# Patient Record
Sex: Male | Born: 1985 | Race: White | Hispanic: No | Marital: Single | Smoking: Current every day smoker
Health system: Southern US, Community
[De-identification: ages and names within clinical notes are randomized; demographics above are authoritative.]

## PROBLEM LIST (undated history)

## (undated) DIAGNOSIS — N200 Calculus of kidney: Secondary | ICD-10-CM

## (undated) DIAGNOSIS — M1990 Unspecified osteoarthritis, unspecified site: Secondary | ICD-10-CM

## (undated) DIAGNOSIS — F419 Anxiety disorder, unspecified: Secondary | ICD-10-CM

## (undated) DIAGNOSIS — M545 Low back pain, unspecified: Secondary | ICD-10-CM

## (undated) DIAGNOSIS — F6089 Other specific personality disorders: Secondary | ICD-10-CM

## (undated) HISTORY — DX: Unspecified osteoarthritis, unspecified site: M19.90

## (undated) HISTORY — DX: Anxiety disorder, unspecified: F41.9

---
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
## 2004-03-16 ENCOUNTER — Emergency Department
Admission: EM | Admit: 2004-03-16 | Disposition: A | Discharge status: Home / Self Care | Payer: Self-pay | Source: Ambulatory Visit

## 2004-03-27 ENCOUNTER — Emergency Department
Admission: EM | Admit: 2004-03-27 | Disposition: A | Discharge status: Home / Self Care | Payer: Self-pay | Source: Ambulatory Visit

## 2004-03-29 ENCOUNTER — Emergency Department
Admission: EM | Admit: 2004-03-29 | Disposition: A | Discharge status: Home / Self Care | Payer: Self-pay | Source: Ambulatory Visit

## 2004-04-01 ENCOUNTER — Emergency Department
Admission: EM | Admit: 2004-04-01 | Disposition: A | Discharge status: Home / Self Care | Payer: Self-pay | Source: Ambulatory Visit

## 2004-08-09 ENCOUNTER — Emergency Department
Admission: EM | Admit: 2004-08-09 | Disposition: A | Discharge status: Home / Self Care | Payer: Self-pay | Source: Ambulatory Visit

## 2008-04-08 ENCOUNTER — Emergency Department
Admission: EM | Admit: 2008-04-08 | Disposition: A | Discharge status: Home / Self Care | Payer: Self-pay | Source: Ambulatory Visit

## 2009-04-05 ENCOUNTER — Emergency Department
Admission: EM | Admit: 2009-04-05 | Disposition: A | Discharge status: Home / Self Care | Payer: Self-pay | Source: Ambulatory Visit

## 2013-10-16 ENCOUNTER — Emergency Department: Payer: Self-pay

## 2013-10-16 ENCOUNTER — Emergency Department
Admission: EM | Admit: 2013-10-16 | Discharge: 2013-10-16 | Disposition: A | Discharge status: Home / Self Care | Payer: Self-pay | Attending: Emergency Medicine | Admitting: Emergency Medicine

## 2013-10-16 DIAGNOSIS — B349 Viral infection, unspecified: Secondary | ICD-10-CM | POA: Insufficient documentation

## 2013-10-16 DIAGNOSIS — F17200 Nicotine dependence, unspecified, uncomplicated: Secondary | ICD-10-CM | POA: Insufficient documentation

## 2013-10-16 LAB — VH STREP A RAPID TEST: Strep A, Rapid: NEGATIVE

## 2013-10-16 NOTE — Discharge Instructions (Signed)
Tylenol or ibuprofen for discomfort. Fluids. Be rechecked if any concerns.    Viral Syndrome (Adult)  A viral illness may cause a number of symptoms. The symptoms depend on the part of the body that the virus affects. If it settles in the nose, throat, and lungs, it may cause cough, sore throat, congestion, and sometimes headache. If it settles in the stomach and intestinal tract, it may cause vomiting and diarrhea. Sometimes it causes vague symptoms like "aching all over," feeling tired, loss of appetite, or fever.  A viral illness usually lasts1 to 2 weeks, but sometimes it lasts longer. In some cases, a more serious infection can look like a viral syndrome in the first few days of the illness. You may need anotherexam and additional teststo know the difference.Watch for the warning signs listed below.  Home care  Follow these guidelines for taking care of yourself at home:   If symptoms are severe, rest at home for the first 2 to 3 days.   Stay away from cigarette smoke - both your smoke and the smoke from others.   You may useacetaminophen or ibuprofen for fever, muscle aching, and headache, unless another medicine was prescribed for this.If you have chronic liver or kidney disease or ever had a stomach ulcer or GI bleeding, talk with your doctor before using these medicinesNo one who is younger than 50 and ill with a fever should take aspirin. It may cause severe liver damage.   Your appetite may be poor, so a light diet is fine. Avoid dehydration by drinking 8 to 12 8-ounce glasses of fluids each day. This may include water; orange juice; lemonade; apple, grape, and cranberry juice; clear fruit drinks; electrolyte replacement and sports drinks; and decaffeinated teas and coffee. If you have been diagnosed with a kidney disease, ask your doctor how much and what types of fluids you should drink to prevent dehydration. If you have kidney disease, drinking too much fluid can cause it build up in the  your body and be dangerous to your health.   Over-the-counter remedies won't shorten the length of the illness but may be helpful forcough, sore throat; and nasal and sinus congestion. Don't use decongestants if you have high blood pressure.  Follow-up care  Follow up with your health care provider if you do not improve over the next week.  When to seek medical care  Get prompt medical attention if any of these occur:   Cough with lots of colored sputum (mucus) or blood in your sputum   Chest pain, shortness of breath, wheezing, or difficulty breathing   Severe headache; face, neck, or ear pain   Severe, constant pain in the lower right side of your belly (abdominal)   Continued vomiting (can't keep liquids down)   Frequent diarrhea (more than 5 times a day); blood (red or black color) or mucus in diarrhea   Feeling weak, dizzy, or like you are going to faint   Extreme thirst   Fever of 100.4 F (38 C) oral or higher, not better with fever medication   [employer]   [address] [place], [place] [address]. All rights reserved. This information is not intended as a substitute for professional medical care. Always follow your healthcare professional's instructions.

## 2013-10-16 NOTE — ED Provider Notes (Addendum)
Physician/Midlevel provider first contact with patient: 10/16/13 1828         History     Chief Complaint   Patient presents with   . Sore Throat   . Cough   . Headache     HPI  1-2d of nonprod cough; slight sore throat, headache and ant chest pain when coughs. No n/v. Had diarrhea the other day, not now. No stiff neck or fevers or rash.  History reviewed. No pertinent past medical history.    History reviewed. No pertinent past surgical history.    No family history on file.    Social  History   Substance Use Topics   . Smoking status: Current Every Day Smoker -- 0.5 packs/day     Types: Cigarettes   . Smokeless tobacco: Not on file   . Alcohol Use: No       .     No Known Allergies    Current/Home Medications    No medications on file        Review of Systems   Constitutional: Negative for fever.   HENT: Positive for sore throat.    Eyes: Negative.    Respiratory: Positive for cough and shortness of breath.    Cardiovascular: Positive for chest pain.   Gastrointestinal: Positive for diarrhea. Negative for nausea, vomiting and abdominal pain.   Genitourinary: Negative.    Musculoskeletal: Negative.  Negative for arthralgias, myalgias, neck pain and neck stiffness.   Skin: Negative.  Negative for rash.   Neurological: Positive for headaches.   All other systems reviewed and are negative.        Physical Exam    BP: 130/69 mmHg, Heart Rate: 75 , Temp: 98.8 F (37.1 C), Resp Rate: 20 , SpO2: 98 %    Physical Exam   Nursing note and vitals reviewed.  Constitutional: He is oriented to person, place, and time. He appears well-developed and well-nourished. No distress.   HENT:   Head: Normocephalic and atraumatic.   Right Ear: External ear normal.   Left Ear: External ear normal.   Mouth/Throat: Oropharynx is clear and moist. No oropharyngeal exudate.   Eyes: EOM are normal. Pupils are equal, round, and reactive to light. Right eye exhibits no discharge. Left eye exhibits discharge.   Neck: Normal range of motion. Neck  supple.   Cardiovascular: Normal rate and regular rhythm.    Pulmonary/Chest: Effort normal and breath sounds normal. He has no wheezes. He has no rales. He exhibits tenderness (tender to palp of ant sternal-costal border 'that's the pain').   Abdominal: Soft. Bowel sounds are normal. There is no tenderness.   Musculoskeletal: Normal range of motion.   Neurological: He is alert and oriented to person, place, and time. No cranial nerve deficit.   Skin: Skin is warm and dry.       MDM and ED Course     ED Medication Orders     None           MDM      Procedures    Clinical Impression & Disposition     Clinical Impression  Final diagnoses:   Viral illness        ED Disposition     Discharge Meiko Stranahan discharge to home/self care.    Condition at disposition: Stable             New Prescriptions    No medications on file  Johney Maine, MD  10/16/13 Lynelle Smoke    Johney Maine, MD  10/16/13 [phone]

## 2013-10-16 NOTE — ED Notes (Addendum)
Patient complained of st, headache, cough for 1 day.  Diarrhea.

## 2013-10-16 NOTE — ED Notes (Signed)
Discharge instructions given to patient  Released to home

## 2014-12-08 ENCOUNTER — Emergency Department: Payer: Self-pay

## 2014-12-08 ENCOUNTER — Emergency Department
Admission: EM | Admit: 2014-12-08 | Discharge: 2014-12-08 | Disposition: A | Discharge status: Home / Self Care | Payer: Self-pay | Attending: Emergency Medicine | Admitting: Emergency Medicine

## 2014-12-08 DIAGNOSIS — B9689 Other specified bacterial agents as the cause of diseases classified elsewhere: Secondary | ICD-10-CM | POA: Insufficient documentation

## 2014-12-08 DIAGNOSIS — J028 Acute pharyngitis due to other specified organisms: Secondary | ICD-10-CM | POA: Insufficient documentation

## 2014-12-08 MED ORDER — AMOXICILLIN 500 MG PO CAPS
500.0000 mg | ORAL_CAPSULE | Freq: Once | ORAL | Status: DC
Start: 2014-12-08 — End: 2014-12-08
  Administered 2014-12-08: 500 mg via ORAL

## 2014-12-08 MED ORDER — AMOXICILLIN 500 MG PO CAPS
500.0000 mg | ORAL_CAPSULE | Freq: Three times a day (TID) | ORAL | Status: DC
Start: 2014-12-08 — End: 2014-12-15

## 2014-12-08 MED ORDER — AMOXICILLIN 500 MG PO CAPS
ORAL_CAPSULE | Freq: Once | ORAL | Status: DC
Start: 2014-12-08 — End: 2014-12-09

## 2014-12-08 NOTE — ED Notes (Signed)
Pt presents ambulating with steady gait from lobby with complaint of body aches for about two days.  Alert/oriented, no obvious distress, febrile, does not know if he has had a fever at home because he does not have a thermometer.  Pt states he took two Tylenol about an hour ago.

## 2014-12-08 NOTE — Discharge Instructions (Signed)
When You Have a Sore Throat  A sore throat can be painful. There are many reasons why you may have a sore throat. Your healthcare provider will work with you to find the cause of your sore throat. He or she will also find the best treatment for you.  What Causes a Sore Throat?  Sore throats can be caused or worsened by:   Cold or flu viruses   Bacteria   Irritants such as tobacco smoke   Acid reflux  A Healthy Throat  The tonsils are on the sides of the throat near the base of the tongue. They collect viruses and bacteria and help fight infection. The throat (pharynx) is the passage for air. Mucus from the nasal cavity also moves down the passage.  An Inflamed Throat  The tonsils and pharynx can become inflamed due to a cold or flu virus. Postnasal drip (excess mucus draining from the nasal cavity) can irritate the throat. It can also make the throat or tonsils more likely to be infected by bacteria. Severe, untreated tonsillitis in children or adults can cause a pocket of pus (abscess) to form near the tonsil.  Your Evaluation  A medical evaluation can help find the cause of your sore throat. It can also help your healthcare providerchoose the best treatment for you. The evaluation may include a health history, physical exam, and diagnostic tests.  Health History  Your healthcare provider may ask you the following:   How long has the sore throat lasted and how have you been treating it?   Do you have any other symptoms, such as body aches, fever, or cough?   Does your sore throat recur? If so, how often? How many days of school or work have you missed because of a sore throat?   Do you have trouble eating or swallowing?   Have you been told that you snore or have other sleep problems?   Do you have bad breath?   Do you cough up bad-tasting mucus?  Physical Exam  During the exam, your healthcare provider checks your ears, nose, and throat for problems. He or she also checks for swelling in the neck,  and may listen to your chest.  Possible Tests  Other tests your healthcare provider may perform include:   A throat swab to check for bacteria such asstreptococcus (the bacteria that causes strep throat)   A blood test to check for mononucleosis (a viral infection)   A chest x-ray to rule out pneumonia, especially if you have a cough  Treating a Sore Throat  Treatment depends on many factors. What is the likely cause? Is the problem recent? Does it keep coming back? In many cases, the best thing to do is to treat the symptoms, rest, and let the problem heal itself. Antibiotics may help clear up some infections. For cases of severe or recurring tonsillitis, the tonsils may need to be removed.     Are Antibiotics Needed?  If your sore throat is due to a bacterial infection, antibiotics may speed healing and prevent complications. But most sore throats are caused by cold or flu viruses. And antibiotics don't treat viral illness. In fact, using antibiotics when they're not needed may produce bacteria that are harder to kill. Your healthcare provider will prescribe antibiotics only if he or she thinks they are likely to help.  If Antibiotics Are Prescribed  Take the medication exactly as directed. Be sure to finish your prescription even if you're feeling better.   And be sure to ask your healthcare provider or pharmacist what side effects are common and what to do about them.  Is Surgery Needed?  In some cases, tonsils need to be removed. This is often done as outpatient (same-day) surgery. Your healthcare provider may advise removing the tonsils in cases of:   Several severe bouts of tonsillitis in a year. "Severe" episodes include those that lead to missed days of school or work, or that need to be treated with antibiotics.   Tonsillitis that causes breathing problems during sleep.   Tonsillitis caused by food particles collecting in pouches in the tonsils (cryptic tonsillitis).     2000-2015 [employer] [address], [place] [address]. All rights reserved. This information is not intended as a substitute for professional medical care. Always follow your healthcare professional's instructions.

## 2014-12-08 NOTE — ED Provider Notes (Signed)
Physician/Midlevel provider first contact with patient: 12/08/14 2242         History     Chief Complaint   Patient presents with   . Generalized Body Aches     HPI Comments: Pt with c/o body aches.  Pt with two days of diffuse moderate body aches esp in chest and back.  No aggravate or alleviate factors. Pt also with sore throat and nasal congestion.  has fever in the ED.  Pt has a dry cough.  Also with nausea but no emesis.    The history is provided by the patient.            History reviewed. No pertinent past medical history.    History reviewed. No pertinent past surgical history.    History reviewed. No pertinent family history.    Social  History   Substance Use Topics   . Smoking status: Current Every Day Smoker -- 0.50 packs/day     Types: Cigarettes   . Smokeless tobacco: Not on file   . Alcohol Use: No       .     No Known Allergies    Home Medications     No Medications           Review of Systems   Constitutional: Positive for fever. Negative for activity change and appetite change.   HENT: Positive for congestion and sore throat. Negative for ear pain and rhinorrhea.    Respiratory: Positive for cough. Negative for shortness of breath.    Cardiovascular: Negative for chest pain.   Gastrointestinal: Negative for nausea, vomiting, abdominal pain and diarrhea.   Musculoskeletal: Positive for myalgias.   Skin: Negative for rash and wound.   Neurological: Negative for headaches.       Physical Exam    BP: 136/72 mmHg, Heart Rate: 92, Temp: (!) 101.1 F (38.4 C), Resp Rate: 18, SpO2: 100 %, Weight: 68.04 kg    Physical Exam   Constitutional: He appears well-developed and well-nourished. He appears distressed.   HENT:   Head: Normocephalic and atraumatic.   Right Ear: External ear normal.   Left Ear: External ear normal.   Nose: Nose normal.   Mouth/Throat: Oropharyngeal exudate (injected) present.   Eyes: Conjunctivae and EOM are normal. Pupils are equal, round, and reactive to light. Right eye exhibits  no discharge. Left eye exhibits no discharge. No scleral icterus.   Neck: Normal range of motion.   Cardiovascular: Normal rate, regular rhythm and normal heart sounds.  Exam reveals no gallop and no friction rub.    No murmur heard.  Pulmonary/Chest: Effort normal and breath sounds normal. No respiratory distress. He has no wheezes. He has no rales.   Lymphadenopathy:     He has no cervical adenopathy.   Skin: Skin is warm and dry. No rash noted. He is not diaphoretic. No erythema.   Psychiatric: He has a normal mood and affect. His behavior is normal.   Nursing note and vitals reviewed.        MDM and ED Course     ED Medication Orders     Start Ordered     Status Ordering Provider    12/08/14 2248 12/08/14 2247  amoxicillin (AMOXIL) 500 mg (take home medication)   Once     Route: Oral  Ordered Dose: 500 mg     Dorna Bloom, Annasofia Pohl W              MDM  Procedures    Clinical Impression & Disposition     Clinical Impression  Final diagnoses:   Bacterial pharyngitis        ED Disposition     Discharge Broden Holt discharge to home/self care.    Condition at disposition: Stable             New Prescriptions    AMOXICILLIN (AMOXIL) 500 MG CAPSULE    Take 1 capsule (500 mg total) by mouth 3 (three) times daily.                   Chucky May, MD  12/08/14 2251

## 2014-12-15 ENCOUNTER — Emergency Department: Payer: Self-pay

## 2014-12-15 ENCOUNTER — Emergency Department
Admission: EM | Admit: 2014-12-15 | Discharge: 2014-12-15 | Disposition: A | Discharge status: Home / Self Care | Payer: Self-pay | Attending: Emergency Medicine | Admitting: Emergency Medicine

## 2014-12-15 DIAGNOSIS — J029 Acute pharyngitis, unspecified: Secondary | ICD-10-CM | POA: Insufficient documentation

## 2014-12-15 DIAGNOSIS — G43009 Migraine without aura, not intractable, without status migrainosus: Secondary | ICD-10-CM | POA: Insufficient documentation

## 2014-12-15 LAB — CBC AND DIFFERENTIAL
Basophils %: 1.3 % (ref 0.0–3.0)
Basophils Absolute: 0.2 10*3/uL (ref 0.0–0.3)
Eosinophils %: 1.8 % (ref 0.0–7.0)
Eosinophils Absolute: 0.3 10*3/uL (ref 0.0–0.8)
Hematocrit: 40.2 % (ref 39.0–52.5)
Hemoglobin: 15.1 gm/dL (ref 13.0–17.5)
Lymphocytes Absolute: 4.2 10*3/uL (ref 0.6–5.1)
Lymphocytes: 30.8 % (ref 15.0–46.0)
MCH: 33 pg (ref 28–35)
MCHC: 38 gm/dL — ABNORMAL HIGH (ref 31–36)
MCV: 88 fL (ref 80–100)
MPV: 6 fL (ref 6.0–10.0)
Monocytes Absolute: 1.3 10*3/uL (ref 0.1–1.7)
Monocytes: 9.4 % (ref 3.0–15.0)
Neutrophils %: 56.7 % (ref 42.0–78.0)
Neutrophils Absolute: 7.8 10*3/uL (ref 1.7–8.6)
PLT CT: 340 10*3/uL (ref 130–440)
RBC: 4.56 10*6/uL (ref 4.00–5.70)
RDW: 10.9 % (ref 10.5–14.5)
WBC: 13.7 10*3/uL — ABNORMAL HIGH (ref 4.00–11.00)

## 2014-12-15 LAB — COMPREHENSIVE METABOLIC PANEL
ALT: 20 U/L (ref 0–55)
AST (SGOT): 18 U/L (ref 10–42)
Albumin/Globulin Ratio: 1.64 Ratio — ABNORMAL HIGH (ref 0.70–1.50)
Albumin: 4.5 gm/dL (ref 3.5–5.0)
Alkaline Phosphatase: 63 U/L (ref 40–145)
Anion Gap: 17.6 mMol/L (ref 7.0–18.0)
BUN / Creatinine Ratio: 15.5 Ratio (ref 10.0–30.0)
BUN: 13 mg/dL (ref 7–22)
Bilirubin, Total: 2.2 mg/dL — ABNORMAL HIGH (ref 0.1–1.2)
CO2: 21 mMol/L (ref 20.0–30.0)
Calcium: 9.9 mg/dL (ref 8.5–10.5)
Chloride: 105 mMol/L (ref 98–110)
Creatinine: 0.84 mg/dL (ref 0.80–1.30)
EGFR: >60 mL/min/{1.73_m2}
Globulin: 2.7 gm/dL (ref 2.0–4.0)
Glucose: 93 mg/dL (ref 70–99)
Osmolality Calc: 278 mOsm/kg (ref 275–300)
Potassium: 4 mMol/L (ref 3.5–5.3)
Protein, Total: 7.2 gm/dL (ref 6.0–8.3)
Sodium: 140 mMol/L (ref 136–147)

## 2014-12-15 LAB — BILIRUBIN, DIRECT: Bilirubin Direct: 0.7 mg/dL — ABNORMAL HIGH (ref 0.0–0.3)

## 2014-12-15 LAB — VH STREP A RAPID TEST: Strep A, Rapid: NEGATIVE

## 2014-12-15 MED ORDER — SODIUM CHLORIDE 0.9 % IV BOLUS
1000.0000 mL | Freq: Once | INTRAVENOUS | Status: AC
Start: 2014-12-15 — End: 2014-12-15
  Administered 2014-12-15: 1000 mL via INTRAVENOUS

## 2014-12-15 MED ORDER — ONDANSETRON HCL 4 MG/2ML IJ SOLN
INTRAMUSCULAR | Status: AC
Start: 2014-12-15 — End: ?
  Filled 2014-12-15: qty 2

## 2014-12-15 MED ORDER — LEVOFLOXACIN 750 MG PO TABS
750.0000 mg | ORAL_TABLET | Freq: Every day | ORAL | Status: AC
Start: 2014-12-15 — End: 2014-12-20

## 2014-12-15 MED ORDER — KETOROLAC TROMETHAMINE 30 MG/ML IJ SOLN
INTRAMUSCULAR | Status: AC
Start: 2014-12-15 — End: ?
  Filled 2014-12-15: qty 1

## 2014-12-15 MED ORDER — KETOROLAC TROMETHAMINE 30 MG/ML IJ SOLN
30.0000 mg | Freq: Once | INTRAMUSCULAR | Status: AC
Start: 2014-12-15 — End: 2014-12-15
  Administered 2014-12-15: 30 mg via INTRAVENOUS

## 2014-12-15 MED ORDER — ACETAMINOPHEN-CODEINE 120-12 MG/5ML PO SOLN
5.0000 mL | Freq: Three times a day (TID) | ORAL | Status: DC | PRN
Start: 2014-12-15 — End: 2015-03-20

## 2014-12-15 MED ORDER — ONDANSETRON HCL 4 MG/2ML IJ SOLN
4.0000 mg | Freq: Once | INTRAMUSCULAR | Status: AC
Start: 2014-12-15 — End: 2014-12-15
  Administered 2014-12-15: 4 mg via INTRAVENOUS

## 2014-12-15 NOTE — Discharge Instructions (Signed)
Migraine Headache  Migraine headaches are caused by changes in blood flow to the brain. This causes throbbing or constant pain on one or both sides of your head. The pain may last from a few hours to several days. You usually will have nausea, vomiting, sensitivity to light and sound, and blurred vision. A migraine may be triggered by emotional stressor depression, or by hormone changes during the menstrual cycle. Other triggers include birth control pills, overuse of migraine medicines, alcohol or caffeine, foods with tyramine, eye strain, weather changes, missed meals,or too little or too much sleep.  Home care  Follow these tips when taking care of yourself at home:   Don't drive yourself home if you were given pain medicine for your headache. Instead, have someone else drive you home. Try to sleep when you get home. You should feel much better when you wake up.   Cold can help ease migraine symptoms. Put an ice pack on your forehead or at the base of your skull. Put heat on the back of your neck to help ease any neck spasm.   Drink only clear liquids or eat a light diet until your symptoms get better. This will help you avoid nausea and vomiting.  How to prevent migraines  Pay attention to what seems to trigger your headache. Try to avoid the triggers when you can. If you have frequent headaches, consider keeping a headache diary. In it, write down what you were doing, feeling, or eating in the hours before each headache. Show this to your health care provider to help find the cause of your headaches.  If stress seems to be a trigger for your headaches, figure out what is causing stress in your life. Learn new ways to handle your stress. Ideas include regular exercise, biofeedback, self-hypnosis, and meditation. Talk with your health care provider to find out more information about managing stress. Many books and digital media are also available on this subject.  Tyramine is a substance found in many  foods. It can trigger a migraine in some people. These foods contain tyramine:   Chocolate   Yogurt   All cheeses but cottage cheese and cream cheese   Smoked or pickled fish and meat, including herring, caviar, bologna, pepperoni, and salami   Liver   Avocados   Bananas   Figs   Raisins   Red wine  Try staying away from these foods for 1 to 2 months to see if you have fewer headaches.  How to treat future headaches   Take time out at the first sign of a headache, if possible. Find a quiet, dark, comfortable place to sit or lie down. Let yourself relax or sleep.   Put an ice pack on your forehead or on the area of greatest pain. A heating pad and massage may help if you are having a muscle spasm and tightness in your neck.   If you have been prescribed a medicine to stop a migraine headache, use this at the first warning sign of the headache for best results. First signs may be an aura or pain.   If you need to take medicine often for your migraine, talk with your health care provider about other ways to prevent your headaches.  Follow-up care  Follow up with your health care provider if your headache doesn't get better within the next 24 hours. Talk with your provider if you have frequent headaches. He or she can figure out a treatment plan. Ask if   you can have medicine to take at home the next time you get a bad headache. This may keep you from having to visit the emergency department in the future. You may need to see a headache specialist (neurologist) if you continue to have headaches.  When to seek medical advice  Call your health care provider right awayif any of these occur:   Your head pain gets worse, or doesn't get better within 24 hours   You can't keep liquids down (repeated vomiting)   Pain in your sinuses, ears, or throat   Fever of 100.4 F (38 C) or higher, or as directed by your health care provider   Stiff neck   Extreme drowsiness, confusion, or fainting   Dizziness, or  dizziness with spinning sensation (vertigo)   Weakness in an arm or leg, or on one side of your face   Difficulty talking or seeing     2000-2015 [employer] [address] [place], [place] [address]. All rights reserved. This information is not intended as a substitute for professional medical care. Always follow your healthcare professional's instructions.          Preventing Migraine Headaches: Triggers  The first step in preventing migraines is to learn what triggers them. You may then be able to control your triggers to avoid or reduce the severity of your migraines.     Know Your Triggers  Be aware that you may have more than1 trigger, and that some triggers may work together. Common migraine triggers include:   Food and nutrition. Skipping meals or not drinking enough water can trigger headaches. So can certain foods, such as caffeine, monosodium glutamate (MSG),aged cheese, or sausage.   Alcohol. Red wine and other alcoholic beverages are common migraine triggers.   Chemicals. Scents, cleaning products, gasoline, glue, perfume, and paint can be triggers. So can tobacco smoke, including secondhand smoke.   Emotions. Stress can trigger headaches or make them worse once they begin.   Sleep disruption. Staying up late, sleeping late, and traveling across time zones can disrupt your sleep cycle, triggering headaches.   Hormones. Many women notice that migraines tend to occur at a certain point in their menstrual cycle. Birth control pills or hormone replacement therapy may also trigger migraines.   Environment and weather. Air travel, changes in altitude, air pressure changes, hot sun, or bright or flashing lights can be triggers.    Control Your Triggers  These are some of the things you can do to try to control triggers:   Avoid triggers if you can. For example, stay clear of alcohol and foods that trigger your headaches. Use unscented household products. Keep regular sleep habits.  Manage stress to help control emotional triggers.   Change your behavior at times when triggers can't be avoided. For example, make sure to get enough rest and drink plenty of water while you're traveling. Make sure to carry a hat, sunglasses, and your medications. Be alert for migraine symptoms so you can treat a migraine early if it happens.   [address] [employer] [address], [place], [place] [address]. All rights reserved. This information is not intended as a substitute for professional medical care. Always follow your healthcare professional's instructions.    When You Have a Sore Throat  A sore throat can be painful. There are many reasons why you may have a sore throat. Your healthcare provider will work with you to find the cause of your sore throat.  He or she will also find the best treatment for you.  What Causes a Sore Throat?  Sore throats can be caused or worsened by:   Cold or flu viruses   Bacteria   Irritants such as tobacco smoke   Acid reflux  A Healthy Throat  The tonsils are on the sides of the throat near the base of the tongue. They collect viruses and bacteria and help fight infection. The throat (pharynx) is the passage for air. Mucus from the nasal cavity also moves down the passage.  An Inflamed Throat  The tonsils and pharynx can become inflamed due to a cold or flu virus. Postnasal drip (excess mucus draining from the nasal cavity) can irritate the throat. It can also make the throat or tonsils more likely to be infected by bacteria. Severe, untreated tonsillitis in children or adults can cause a pocket of pus (abscess) to form near the tonsil.  Your Evaluation  A medical evaluation can help find the cause of your sore throat. It can also help your healthcare providerchoose the best treatment for you. The evaluation may include a health history, physical exam, and diagnostic tests.  Health History  Your healthcare provider may ask you the following:   How long  has the sore throat lasted and how have you been treating it?   Do you have any other symptoms, such as body aches, fever, or cough?   Does your sore throat recur? If so, how often? How many days of school or work have you missed because of a sore throat?   Do you have trouble eating or swallowing?   Have you been told that you snore or have other sleep problems?   Do you have bad breath?   Do you cough up bad-tasting mucus?  Physical Exam  During the exam, your healthcare provider checks your ears, nose, and throat for problems. He or she also checks for swelling in the neck, and may listen to your chest.  Possible Tests  Other tests your healthcare provider may perform include:   A throat swab to check for bacteria such asstreptococcus (the bacteria that causes strep throat)   A blood test to check for mononucleosis (a viral infection)   A chest x-ray to rule out pneumonia, especially if you have a cough  Treating a Sore Throat  Treatment depends on many factors. What is the likely cause? Is the problem recent? Does it keep coming back? In many cases, the best thing to do is to treat the symptoms, rest, and let the problem heal itself. Antibiotics may help clear up some infections. For cases of severe or recurring tonsillitis, the tonsils may need to be removed.     Are Antibiotics Needed?  If your sore throat is due to a bacterial infection, antibiotics may speed healing and prevent complications. But most sore throats are caused by cold or flu viruses. And antibiotics don't treat viral illness. In fact, using antibiotics when they're not needed may produce bacteria that are harder to kill. Your healthcare provider will prescribe antibiotics only if he or she thinks they are likely to help.  If Antibiotics Are Prescribed  Take the medication exactly as directed. Be sure to finish your prescription even if you're feeling better. And be sure to ask your healthcare provider or pharmacist what side  effects are common and what to do about them.  Is Surgery Needed?  In some cases, tonsils need to be removed. This is often done  as outpatient (same-day) surgery. Your healthcare provider may advise removing the tonsils in cases of:   Several severe bouts of tonsillitis in a year. "Severe" episodes include those that lead to missed days of school or work, or that need to be treated with antibiotics.   Tonsillitis that causes breathing problems during sleep.   Tonsillitis caused by food particles collecting in pouches in the tonsils (cryptic tonsillitis).     [address] [employer] [address], [place], [place] [address]. All rights reserved. This information is not intended as a substitute for professional medical care. Always follow your healthcare professional's instructions.

## 2014-12-15 NOTE — ED Provider Notes (Signed)
HPI/ROS provided by:   [x]  Patient   []  Family   []  EMS   []  Friend   []  Caregiver   []  POA     Chief Complaint   Patient presents with   . Sore Throat       HPI:     29 y.o. male presents with repeat visit for worsening sore throat with L sided HA; got Amoxicillin 01-May 16 and reports he is down to a few pills.      Denies any CHI or trauma; no n/v/d    Per pt, he did have some mid-back pain when he first came in, but it is better now    REVIEW OF SYSTEMS:  CONSTITUTIONAL: + subjective fevers, - chills, - sweating   EYES: - problems with vision; + photophobia   ENT: - sore throat  RESP: - sob, - cough   CV: + chest pain   GI: - abdominal pain, - nausea/vomiting, - diarrhea, - blood in stools  NEURO: + headache, - focal weakness  GU: - dysuria, - hematuria  MS: - leg/calf pain, - leg swelling  SKIN: - rash    PSYCH: - anxiety, - depression  [x]  Except as marked above as positive, the remainder of the systems above were reviewed and found negative.    PAST MEDICAL/SURGICAL HISTORY:  History reviewed. No pertinent past medical history.  History reviewed. No pertinent past surgical history.  Additional Past Medical/Surgical History:     SOCIAL AND FAMILY HISTORY:  History   Substance Use Topics   . Smoking status: Current Every Day Smoker -- 0.50 packs/day     Types: Cigarettes   . Smokeless tobacco: Not on file   . Alcohol Use: No     History reviewed. No pertinent family history.   Additional Social and Family History:    Additional SH: [x]  No additional social history, I reviewed SH above     []  No EtOH/Tobacco/Drugs     []  Tobacco    []  Alcohol:      []  Narcotic abuse    []  Marijuana     []  IVDU:    Other:    Additional FH: [x]  Not asked/Non-contributory, I reviewed FH above    []  Negative    []  Diabetes    []  Cancer      []  CAD    []  HTN    []  Sudden Death    Other:    No Known Allergies  Additional Allergies:    Prior to Admission medications    Medication Sig Start Date End Date Taking? Authorizing Provider    amoxicillin (AMOXIL) 500 MG capsule Take 1 capsule (500 mg total) by mouth 3 (three) times daily. 12/08/14 12/18/14  Chucky May, MD       PHYSICAL EXAM:   BP 121/76 mmHg  Pulse 82  Temp(Src) 98.2 F (36.8 C)  Resp 16  Ht 1.778 m  Wt 65.772 kg  BMI 20.81 kg/m2  SpO2 97%  [x]  Nursing note reviewed [x]  Vitals reviewed     Gen: AAOx4; NAD  HEENT: PERRLA, EOMi; moist MM  CVS: RRR, nl s1/s2; no r/m/g  Lungs: CTAB; no wheezes, crackles, rhonchi  Abd: soft, NT/ND; NABS; no CVATB  Neuro: CN 2-12 grossly intact; no focal deficits  Psych: appropiate affect, normal thought content and processes  Skin: warm, dry, no rashes noted  Extremities: MWAE; neurovascular intact on exam; GCS 15    LABS/TESTS:  Labs Reviewed   CBC AND  DIFFERENTIAL - Abnormal; Notable for the following:     WBC 13.7 (*)     MCHC 38 (*)     All other components within normal limits   COMPREHENSIVE METABOLIC PANEL - Abnormal; Notable for the following:     Bilirubin, Total 2.2 (*)     Albumin/Globulin Ratio 1.64 (*)     All other components within normal limits   BILIRUBIN, DIRECT - Abnormal; Notable for the following:     Bilirubin, Direct 0.7 (*)     All other components within normal limits   VH STREP A RAPID TEST   VH CULTURE, THROAT       RADIOLOGY (viewed & interpreted by me--followed by final report):  Xr Chest Ap Portable    12/15/2014   Normal Chest.  ReadingStation:[id number]      ASSESSMENT:   28 y.o. male with 1) throat pain, 2) L sided migraine HA      PLAN:  1) With still elevated WBC despite Amoxil for 7 days, but no fevers/chills  -? Related to stress (very uncomfortable on arrival) and/or smoking  -Meds and Rx as below  -See ENT at [facility]    2) Improved with meds and IVF  -Meds and Rx as below        Medications   sodium chloride 0.9 % bolus 1,000 mL (0 mLs Intravenous Stopped 12/15/14 0435)   ketorolac (TORADOL) injection 30 mg (30 mg Intravenous Given 12/15/14 0336)   ondansetron (ZOFRAN) injection 4 mg (4 mg Intravenous Given 12/15/14 0336)        Discharge Medication List as of 12/15/2014  4:30 AM      START taking these medications    Details   acetaminophen-codeine (TYLENOL W/ CODEINE) 120-12 MG/5ML solution Take 5 mLs by mouth every 8 (eight) hours as needed for Pain., Starting 12/15/2014, Until Discontinued, Print      levofloxacin (LEVAQUIN) 750 MG tablet Take 1 tablet (750 mg total) by mouth daily., Starting 12/15/2014, Until Fri 12/20/14, Print                           Kathrene Alu, MD  12/15/14 [phone]

## 2014-12-15 NOTE — ED Notes (Signed)
Pt presents with complaint of ongoing sore throat and "something just ain't right".  Pt currently on Amoxicillan for pharyngitis, was sees here in the ED about a week ago for same complaint.  Pt very odd acting, evasive with answering questions, will not make eye contact, fidgety.  When pt asked if he drove himself to the hospital, pt stated "yeah, but my fiance is coming, ya'll better do something quick."

## 2014-12-15 NOTE — ED Notes (Signed)
Dr Guy Begin at bedside to assess pt.

## 2015-03-20 ENCOUNTER — Emergency Department
Admission: EM | Admit: 2015-03-20 | Discharge: 2015-03-20 | Disposition: A | Discharge status: Home / Self Care | Payer: Self-pay | Attending: Emergency Medicine | Admitting: Emergency Medicine

## 2015-03-20 ENCOUNTER — Ambulatory Visit
Admission: RE | Admit: 2015-03-20 | Discharge: 2015-03-20 | Disposition: A | Discharge status: Home / Self Care | Payer: Self-pay | Source: Ambulatory Visit | Attending: Occupational Medicine | Admitting: Occupational Medicine

## 2015-03-20 ENCOUNTER — Emergency Department: Payer: Self-pay

## 2015-03-20 DIAGNOSIS — S39012A Strain of muscle, fascia and tendon of lower back, initial encounter: Secondary | ICD-10-CM | POA: Insufficient documentation

## 2015-03-20 DIAGNOSIS — W1839XA Other fall on same level, initial encounter: Secondary | ICD-10-CM | POA: Insufficient documentation

## 2015-03-20 MED ORDER — METHYLPREDNISOLONE 4 MG PO TBPK
ORAL_TABLET | ORAL | Status: DC
Start: 2015-03-20 — End: 2016-01-01

## 2015-03-20 MED ORDER — TRAMADOL HCL 50 MG PO TABS
50.0000 mg | ORAL_TABLET | Freq: Three times a day (TID) | ORAL | Status: DC | PRN
Start: 2015-03-20 — End: 2015-10-05

## 2015-03-20 MED ORDER — CYCLOBENZAPRINE HCL 10 MG PO TABS
10.0000 mg | ORAL_TABLET | Freq: Three times a day (TID) | ORAL | Status: DC | PRN
Start: 2015-03-20 — End: 2019-12-05

## 2015-03-20 NOTE — Discharge Instructions (Signed)
Back Sprain or Strain    You have injured the muscles (strain) or ligaments (sprain) around the spine. This may occur after a sudden forceful twisting or bending force (such as in a car accident), after a simple awkward movement, or after lifting something heavy with poor body positioning. In either case, muscle spasm is often present and adds to the pain.  A back sprain or muscle strain usually gets better in 1-2 weeks. Unless you had a forceful physical injury (for example, a car accident or fall), X-rays are usually not ordered for the initial evaluation of a back sprain or strain. If pain continues and does not respond to medical treatment, X-rays and other tests may be performed at a later time.  Home care  The following guidelines will help you care for your injury at home:   You may need to stay in bed the first few days. But, as soon as possible, begin sitting or walking to avoid problems with prolonged bed rest (muscle weakness, worsening back stiffness and pain, blood clots in the legs).   When in bed, try to find a position of comfort. A firm mattress is best. Try lying flat on your back with pillows under your knees. You can also try lying on your side with your knees bent up towards your chest and a pillow between your knees.   Avoid prolonged sitting. This puts more stress on the lower back than standing or walking.   During the first two days after injury, apply anice packto the painful area for 20 minutes every 2-4 hours. This will reduce swelling and pain.Heat(hot shower, hot bath or heating pad) works well for muscle spasm. You can start with ice, then switch to heat after two days. Some patients feel best alternating ice and heat treatments. Use the one method that feels the best to you.   You may use acetaminophen or ibuprofen to control pain, unless another pain medicine was prescribed. If you have chronic liver or kidney disease or ever had a stomach ulcer or GI bleeding, talk with  your doctor before using these medicines.   Be aware of safe lifting methods and do not lift anything over 15 pounds until all the pain is gone.  Follow-up care  Follow up with your doctor or this facility as advised. Physical therapy or further tests may be needed if symptoms worsen.  If you had X-rays today, they didn't show any broken bones, breaks, or fractures. Sometimes fractures don't show up on the first X-ray. Bruises and sprains can sometimes hurt as much as a fracture. These injuries can take time to heal completely. If your symptoms don't improve or they get worse, talk with your doctor. You may need a repeat X-ray.  When to seek medical advice  Call your health care provider right awayif any of these occur:   Pain becomes worse or spreads to your arms or legs   Weakness or numbness in one or both arms or legs   Loss of bowel or bladder control   Numbness in the groin or genital area   2000-2015 [employer] [address], [place] [address]. All rights reserved. This information is not intended as a substitute for professional medical care. Always follow your healthcare professional's instructions.

## 2015-03-20 NOTE — ED Provider Notes (Signed)
Physician/Midlevel provider first contact with patient: 03/20/15 0936         History     Chief Complaint   Patient presents with   . Back Pain     PAIN RADIATING DOWN RT LEG     HPI Comments: Pt with c/o back pain.  About two week s ago pt fell about ten feet out of a tree.  He landed on his feet and heard a pop.  Since then pt with right lower back pain that radiates down right thigh.  No weakness or numbness.  Pain is worse with motion and better at rest.      Patient is a 29 y.o. male presenting with back pain. The history is provided by the patient.   Back Pain  Location:  Lumbar spine  Quality:  Aching  Radiates to:  R posterior upper leg  Pain severity:  Severe  Pain is:  Same all the time  Onset quality:  Sudden  Duration:  2 weeks  Timing:  Constant  Progression:  Unchanged  Chronicity:  New  Context: falling and recent injury    Relieved by:  Lying down  Worsened by:  Movement  Associated symptoms: no abdominal pain, no bladder incontinence, no bowel incontinence, no chest pain, no fever, no headaches, no numbness, no paresthesias, no tingling and no weakness         Nursing (triage) note reviewed for the following pertinent information:    LBP RT SIDE, INJURY 2 WEEKS AGO, PT STATES HE FELL OUT OF TREE APPROX 10 FT DROP. PT STATES HE LANDED ON HIS FEET    History reviewed. No pertinent past medical history.    History reviewed. No pertinent past surgical history.    No family history on file.    Social  Social History   Substance Use Topics   . Smoking status: Current Every Day Smoker -- 0.50 packs/day     Types: Cigarettes   . Smokeless tobacco: None   . Alcohol Use: No       .     No Known Allergies    Home Medications     Last Medication Reconciliation Action:  Complete Marjo Bicker, RN 03/20/2015  9:44 AM                  acetaminophen (TYLENOL) 500 MG tablet     Take 500 mg by mouth.     ibuprofen (ADVIL,MOTRIN) 100 MG tablet     Take 100 mg by mouth every 6 (six) hours as needed for Fever.      oxyCODONE (OXY-IR) 5 MG capsule     Take 5 mg by mouth every 4 (four) hours as needed.                     Review of Systems   Constitutional: Negative for fever, activity change and appetite change.   HENT: Negative for congestion, ear pain, rhinorrhea and sore throat.    Respiratory: Negative for cough and shortness of breath.    Cardiovascular: Negative for chest pain.   Gastrointestinal: Negative for nausea, vomiting, abdominal pain, diarrhea and bowel incontinence.   Genitourinary: Negative for bladder incontinence.   Musculoskeletal: Positive for back pain.   Skin: Negative for rash and wound.   Neurological: Negative for tingling, weakness, numbness, headaches and paresthesias.       Physical Exam    BP: 118/79 mmHg, Heart Rate: (!) 120, Temp: 98.7 F (37.1 C),  Resp Rate: 14, SpO2: 99 %, Weight: 63.504 kg    Physical Exam   Constitutional: He is oriented to person, place, and time. He appears well-developed and well-nourished. He appears distressed.   Musculoskeletal: He exhibits tenderness.        Lumbar back: He exhibits decreased range of motion, tenderness and spasm. He exhibits no swelling, no edema, no deformity, no laceration and normal pulse.        Back:    Neurological: He is alert and oriented to person, place, and time. He has normal reflexes. He displays normal reflexes. He exhibits normal muscle tone.   Skin: Skin is warm and dry. No rash noted. He is not diaphoretic. No erythema.   Psychiatric: He has a normal mood and affect. His behavior is normal.         MDM and ED Course     ED Medication Orders     None           Radiologic Studies  Radiology Results (24 Hour)     Procedure Component Value Units Date/Time    XR LUMBAR SPINE 4+ VIEWS [[id number]] Collected:  03/20/15 1040    Order Status:  Completed Updated:  03/20/15 1044    Narrative:      Clinical History:  right SI pain post ten foot fall    Examination:  AP, lateral, spot and both oblique views of the lumbar  spine.    Comparison:  None available.    Findings:  No acute fracture is identified. There is a well-corticated defect through the posterior elements of S1 compatible with  spina bifida occulta. Vertebral body heights are preserved. There is no significant spondylolisthesis. No loss of disc  height is identified.      Impression:      No plain film evidence of fracture.    ReadingStation:[id number]      .          MDM          Procedures    Clinical Impression & Disposition     Clinical Impression  Final diagnoses:   Sacroiliac strain, initial encounter        ED Disposition     Discharge Kyle Hurley discharge to home/self care.    Condition at disposition: Stable             New Prescriptions    CYCLOBENZAPRINE (FLEXERIL) 10 MG TABLET    Take 1 tablet (10 mg total) by mouth 3 (three) times daily as needed for Muscle spasms.    METHYLPREDNISOLONE (MEDROL) 4 MG TABLET    follow package directions    TRAMADOL (ULTRAM) 50 MG TABLET    Take 1 tablet (50 mg total) by mouth every 8 (eight) hours as needed for Pain.                   Chucky May, MD  03/20/15 1050

## 2015-03-20 NOTE — ED Notes (Signed)
2 WEEKS AGO FELL OUT OF TREE APPROX 10FT IN HEIGHT, LANDED ON FEET, BUT HEARD A POP. PAIN RADIATING DOWN BACK OF RT LEG

## 2015-10-05 ENCOUNTER — Emergency Department: Payer: Self-pay

## 2015-10-05 ENCOUNTER — Emergency Department
Admission: EM | Admit: 2015-10-05 | Discharge: 2015-10-05 | Disposition: A | Discharge status: Home / Self Care | Payer: Self-pay | Attending: Emergency Medicine | Admitting: Emergency Medicine

## 2015-10-05 DIAGNOSIS — M5442 Lumbago with sciatica, left side: Secondary | ICD-10-CM | POA: Insufficient documentation

## 2015-10-05 DIAGNOSIS — W14XXXA Fall from tree, initial encounter: Secondary | ICD-10-CM | POA: Insufficient documentation

## 2015-10-05 HISTORY — DX: Low back pain, unspecified: M54.50

## 2015-10-05 MED ORDER — HYDROCODONE-ACETAMINOPHEN 5-325 MG PO TABS
ORAL_TABLET | ORAL | Status: AC
Start: 2015-10-05 — End: ?
  Filled 2015-10-05: qty 2

## 2015-10-05 MED ORDER — HYDROCODONE-ACETAMINOPHEN 7.5-300 MG PO TABS
1.0000 | ORAL_TABLET | ORAL | Status: DC | PRN
Start: 2015-10-05 — End: 2016-01-01

## 2015-10-05 MED ORDER — KETOROLAC TROMETHAMINE 60 MG/2ML IM SOLN
INTRAMUSCULAR | Status: AC
Start: 2015-10-05 — End: ?
  Filled 2015-10-05: qty 2

## 2015-10-05 MED ORDER — KETOROLAC TROMETHAMINE 60 MG/2ML IM SOLN
60.0000 mg | Freq: Once | INTRAMUSCULAR | Status: AC
Start: 2015-10-05 — End: 2015-10-05
  Administered 2015-10-05: 60 mg via INTRAMUSCULAR

## 2015-10-05 MED ORDER — HYDROCODONE-ACETAMINOPHEN 5-325 MG PO TABS
ORAL_TABLET | Freq: Once | ORAL | Status: AC
Start: 2015-10-05 — End: 2015-10-05

## 2015-10-05 MED ORDER — DIAZEPAM 5 MG PO TABS
5.0000 mg | ORAL_TABLET | Freq: Four times a day (QID) | ORAL | Status: DC | PRN
Start: 2015-10-05 — End: 2016-01-01

## 2015-10-05 NOTE — ED Notes (Signed)
Bed: ED6-A  Expected date:   Expected time:   Means of arrival:   Comments:  COMPUTER NOT WORKING

## 2015-10-05 NOTE — ED Notes (Signed)
Pt here for lower back pain with R leg numbness. He states that he fell 15ft out of tree earlier today. Pt reported a previous problem with L4 and is seen in [place] for it.

## 2015-10-05 NOTE — ED Provider Notes (Signed)
Physician/Midlevel provider first contact with patient: 10/05/15 1910         History     Chief Complaint   Patient presents with   . Back Pain     Patient is a 30 y.o. male presenting with back pain. The history is provided by the patient. No language interpreter was used.   Back Pain  Location:  Thoracic spine and lumbar spine  Quality:  Aching  Radiates to:  L posterior upper leg  Pain severity:  Moderate  Onset quality:  Sudden  Duration:  8 hours  Timing:  Constant  Context: falling    Context comment:  The patient fell 16 feet from a tree to the groung  Worsened by:  Movement  Associated symptoms: no abdominal pain, no bowel incontinence, no chest pain, no fever, no numbness, no paresthesias and no perianal numbness    he landed on his legs then rolled to his back. The pain in on the left side of the back. 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  Due to see an orthopedic doctor soon in [place]    Nursing (triage) note reviewed for the following pertinent information:         Past Medical History   Diagnosis Date   . Lumbar back pain        No past surgical history on file.    No family history on file.    Social  Social History   Substance Use Topics   . Smoking status: Current Every Day Smoker -- 0.50 packs/day     Types: Cigarettes   . Smokeless tobacco: None   . Alcohol Use: No       .     No Known Allergies    Home Medications                   acetaminophen (TYLENOL) 500 MG tablet     Take 500 mg by mouth.     cyclobenzaprine (FLEXERIL) 10 MG tablet     Take 1 tablet (10 mg total) by mouth 3 (three) times daily as needed for Muscle spasms.     ibuprofen (ADVIL,MOTRIN) 100 MG tablet     Take 100 mg by mouth every 6 (six) hours as needed for Fever.     methylPREDNISolone (MEDROL) 4 MG tablet     follow package directions                                         Review of Systems   Constitutional: Negative for fever.   Cardiovascular: Negative for chest pain.   Gastrointestinal:  Negative for abdominal pain and bowel incontinence.   Musculoskeletal: Positive for back pain.   Neurological: Negative for numbness and paresthesias.   All other systems reviewed and are negative.      Physical Exam    BP: 115/84 mmHg, Heart Rate: 70, Temp: 97.5 F (36.4 C), Resp Rate: (!) 24, SpO2: 97 %, Weight: 63.504 kg    Physical Exam   Constitutional: He is oriented to person, place, and time. He appears well-developed and well-nourished.   HENT:   Head: Normocephalic and atraumatic.   Eyes: EOM are normal. Pupils are equal, round, and reactive to light.   Neck: Neck supple.   Cardiovascular: Normal rate, regular rhythm and normal heart sounds.    Pulmonary/Chest:  Effort normal and breath sounds normal. He exhibits no tenderness.   Abdominal: Soft. There is no tenderness.   Musculoskeletal:   Mild to moderate tenderness left paraspinous lumbar and lower to mid thorax   Neurological: He is alert and oriented to person, place, and time. He has normal reflexes.   Skin: Skin is warm and dry.   Psychiatric: He has a normal mood and affect. His behavior is normal.   Nursing note and vitals reviewed.        MDM and ED Course     ED Medication Orders     Start Ordered     Status Ordering Provider    10/05/15 2005 10/05/15 2004  HYDROcodone-acetaminophen (NORCO) 5-325 MG 1 tablet (take home medication)   Once     Route: Oral  Ordered Dose: 1 tablet     Ordered Charissa Bash    10/05/15 1926 10/05/15 1925  ketorolac (TORADOL) injection 60 mg   Once in ED     Route: Intramuscular  Ordered Dose: 60 mg     Last MAR action:  Given Doylene Canard S         Radiology Results (24 Hour)     Procedure Component Value Units Date/Time    XR THORACIC SPINE AP LATERAL AND SWIMMERS [[id number]] Collected:  10/05/15 1944    Order Status:  Completed Updated:  10/05/15 1946    Narrative:      Clinical History:  Reason For Exam:  pain  Fell from a tree, mid to lower back pain, patient states he has chronic back  pain.    Examination:  AP, lateral and swimmer's views of the thoracic spine.    Comparison:  None available.    Findings:  Thoracic vertebral body heights and disc spaces are well-maintained with normal alignment. Pedicles appear intact with  no paraspinal hematoma. No focal parenchymal lung infiltrate or pneumothorax.      Impression:      No observed fracture or malalignment of the thoracic spine.    ReadingStation:[id number]    XR Lumbar Spine AP And Lateral [[id number]] Collected:  10/05/15 1942    Order Status:  Completed Updated:  10/05/15 1945    Narrative:      Clinical History:  Reason For Exam:  Lumbar back pain  Fall from tree, mid to lower back pain, hx of chronic back pain.    Examination:  AP and lateral views of the lumbar spine.    Comparison:  03/20/2015    Findings:  Lumbar vertebral body heights and disc spaces are well-maintained with normal alignment. Pedicles appear intact.  Sacroiliac joints appear intact.      Impression:      No observed fracture or malalignment of the lumbar spine. No apparent change from 03/20/2015.    ReadingStation:[id number]            MDM    8:04 PM   The patient has had a good response to valium in the past.      Procedures    Clinical Impression & Disposition     Clinical Impression  Final diagnoses:   Acute left-sided low back pain with left-sided sciatica        ED Disposition     Discharge Genelle Bal discharge to home/self care.    Condition at disposition: Stable             New Prescriptions    DIAZEPAM (VALIUM) 5 MG TABLET    Take 1  tablet (5 mg total) by mouth every 6 (six) hours as needed for Anxiety.    HYDROCODONE-ACETAMINOPHEN (VICODIN ES) 7.5-300 MG TAB    Take 1 tablet by mouth every 4 (four) hours as needed.                   Charissa Bash, MD  10/05/15 2005

## 2015-10-05 NOTE — Special Discharge Instructions (Signed)
Keep follow-up appointment you have with Dr. Dorise Hiss

## 2016-01-01 ENCOUNTER — Emergency Department: Payer: Self-pay

## 2016-01-01 ENCOUNTER — Emergency Department
Admission: EM | Admit: 2016-01-01 | Discharge: 2016-01-01 | Disposition: A | Discharge status: Home / Self Care | Payer: Self-pay | Attending: Emergency Medicine | Admitting: Emergency Medicine

## 2016-01-01 DIAGNOSIS — M5431 Sciatica, right side: Secondary | ICD-10-CM

## 2016-01-01 DIAGNOSIS — M5441 Lumbago with sciatica, right side: Secondary | ICD-10-CM | POA: Insufficient documentation

## 2016-01-01 DIAGNOSIS — W11XXXA Fall on and from ladder, initial encounter: Secondary | ICD-10-CM | POA: Insufficient documentation

## 2016-01-01 MED ORDER — CYCLOBENZAPRINE HCL 10 MG PO TABS
ORAL_TABLET | ORAL | Status: AC
Start: 2016-01-01 — End: ?
  Filled 2016-01-01: qty 1

## 2016-01-01 MED ORDER — HYDROCODONE-ACETAMINOPHEN 5-325 MG PO TABS
1.0000 | ORAL_TABLET | Freq: Once | ORAL | Status: AC
Start: 2016-01-01 — End: 2016-01-01
  Administered 2016-01-01: 1 via ORAL

## 2016-01-01 MED ORDER — CYCLOBENZAPRINE HCL 10 MG PO TABS
10.0000 mg | ORAL_TABLET | Freq: Three times a day (TID) | ORAL | Status: DC | PRN
Start: 2016-01-01 — End: 2019-12-05

## 2016-01-01 MED ORDER — CYCLOBENZAPRINE HCL 10 MG PO TABS
10.0000 mg | ORAL_TABLET | Freq: Three times a day (TID) | ORAL | Status: DC | PRN
Start: 2016-01-01 — End: 2016-01-01
  Administered 2016-01-01: 10 mg via ORAL

## 2016-01-01 MED ORDER — HYDROCODONE-ACETAMINOPHEN 5-325 MG PO TABS
ORAL_TABLET | ORAL | Status: AC
Start: 2016-01-01 — End: ?
  Filled 2016-01-01: qty 1

## 2016-01-01 MED ORDER — HYDROCODONE-ACETAMINOPHEN 5-325 MG PO TABS
1.0000 | ORAL_TABLET | Freq: Four times a day (QID) | ORAL | Status: DC | PRN
Start: 2016-01-01 — End: 2017-11-24

## 2016-01-01 NOTE — Discharge Instructions (Signed)
Sciatica    Sciatica is a condition that causes pain in the lower back that spreads down into the buttock, hip, and leg. Sometimes the leg pain can happen without any back pain. Sciatica happens when a spinal nerve is irritated or has pressure put on it as comes out of the spinal canal in the lower back. This most often happens when a bulge or rupture of a nearby spinal disk presses on the nerve. Sciatica can also be caused by a narrowing of the spinal canal (spinal stenosis) or spasm of the muscle in the buttocks that the sciatic nerve passes through (pyriform muscle). Sciatica is also called lumbar radiculopathy.  Sciatica may begin after a sudden twisting or bending force, such as in a car accident. Or it can happen after a simple awkward movement. In either case, muscle spasm often also happens. Muscle spasm makes the pain worse.  A healthcare provider makes a diagnosis of sciatica from your symptoms and a physical exam. Unless you had an injury from a car accident or fall, you usually won't have X-rays taken at this time. This is because the nerves and disks in your back can't be seen on an X-ray. If the provider sees signs of a compressed nerve, you will need to schedule an MRI scan as an outpatient. Signs of a compressed nerve include loss of strength in a leg.  Most sciatica gets better with medicine, exercise, and physical therapy. If your symptoms continue after at least 3 months of medical treatment, you may need surgery or injections to your lower back.  Home care  Follow these tips when caring for yourself at home:   You may need to stay in bed the first few days. But as soon as possible, begin sitting up or walking. This will help you avoid problems that come from staying in bed for long periods.   When in bed, try to find a position that is comfortable. A firm mattress is best. Try lying flat on your back with pillows under your knees. You can also try lying on your side with your knees bent up  toward your chest and a pillow between your knees.   Avoid sitting for long periods. This puts more stress on your lower back than standing or walking.   Use heat from a hot shower, hot bath, or heating pad to help ease pain. Massage can also help. You can also try using an ice pack. You can make your own ice pack by putting ice cubes in a plastic bag. Wrap the bag in a thin towel. Try both heat and cold to see which works best. Use the method that feels best for 20 minutes several times a day.   You may use acetaminophen or ibuprofen to ease pain, unless another pain medicine was prescribed. Note: If you have chronic liver or kidney disease, talk with your healthcare provider before taking these medicines. Also talk with your provider if you've had a stomach ulcer or gastrointestinal bleeding.   Use safe lifting methods. Don't lift anything heavier than 15 pounds until all of the pain is gone.  Follow-up care  Follow up with your healthcare provider, or as advised. You may need physical therapy or additional tests.  If X-rays were taken, a radiologist will look at them. You will be told of any new findings that may affect your care.  When to seek medical advice  Call your healthcare provider right awayif any of these occur:   Pain   gets worse even after taking prescribed medicine   Weakness or numbness in 1 or both legs or hips   Numbness in your groin or genital area   You can't control your bowel or bladder   Fever   Redness or swelling over your back or spine  Date Last Reviewed: 03/10/2015   2000-2016 [employer] [address], [place] [address]. All rights reserved. This information is not intended as a substitute for professional medical care. Always follow your healthcare professional's instructions.

## 2016-01-01 NOTE — ED Provider Notes (Signed)
Physician/Midlevel provider first contact with patient: 01/01/16 1114         History     Chief Complaint   Patient presents with   . Back Pain     HPI Comments: Pt with c/o low back pain. Pt with h/o right sided sciatica since falling from tree.  Pt states this morning fell when getting off roof onto ladder.  Pt landed on feet and then on to buttocks.  No LOC.  Pt with c/o right lower back pain with radiation.  No weakness or numbness .  Pain is worse with standing.    Patient is a 30 y.o. male presenting with back pain. The history is provided by the patient.   Back Pain  Location:  Lumbar spine  Quality:  Aching  Radiates to:  R posterior upper leg and R knee  Pain severity:  Severe  Pain is:  Same all the time  Onset quality:  Sudden  Duration:  4 hours  Timing:  Constant  Progression:  Unchanged  Chronicity:  Chronic  Context: falling    Worsened by:  Standing and movement  Ineffective treatments:  None tried  Associated symptoms: no abdominal pain, no bladder incontinence, no bowel incontinence, no chest pain, no fever, no headaches, no numbness and no paresthesias         Nursing (triage) note reviewed for the following pertinent information:  Patient fell approx 10 ft from ladder this am c/o right-sided low back pain radiating down right leg - patient history of sciatica - right side.  Denies LOC    Past Medical History   Diagnosis Date   . Lumbar back pain        History reviewed. No pertinent past surgical history.    History reviewed. No pertinent family history.    Social  Social History   Substance Use Topics   . Smoking status: Current Every Day Smoker -- 0.50 packs/day     Types: Cigarettes   . Smokeless tobacco: None   . Alcohol Use: No       .     No Known Allergies    Home Medications                   acetaminophen (TYLENOL) 500 MG tablet     Take 500 mg by mouth.     cyclobenzaprine (FLEXERIL) 10 MG tablet     Take 1 tablet (10 mg total) by mouth 3 (three) times daily as needed for Muscle  spasms.     ibuprofen (ADVIL,MOTRIN) 100 MG tablet     Take 100 mg by mouth every 6 (six) hours as needed for Fever.           Review of Systems   Constitutional: Negative for fever, activity change and appetite change.   HENT: Negative for congestion, ear pain, rhinorrhea and sore throat.    Respiratory: Negative for cough and shortness of breath.    Cardiovascular: Negative for chest pain.   Gastrointestinal: Negative for nausea, vomiting, abdominal pain, diarrhea and bowel incontinence.   Genitourinary: Negative for bladder incontinence, decreased urine volume and difficulty urinating.   Musculoskeletal: Positive for back pain.   Skin: Negative for rash and wound.   Neurological: Negative for numbness, headaches and paresthesias.       Physical Exam    BP: 142/86 mmHg, Heart Rate: (!) 122, Temp: 97.8 F (36.6 C), Resp Rate: (!) 28, SpO2: 100 %, Weight: 65.772 kg  Physical Exam   Constitutional: He is oriented to person, place, and time. He appears well-developed and well-nourished. He appears distressed.   Cardiovascular: Normal rate, regular rhythm and normal heart sounds.  Exam reveals no gallop and no friction rub.    No murmur heard.  Pulmonary/Chest: Effort normal and breath sounds normal. No respiratory distress. He has no wheezes. He has no rales.   Abdominal: Soft. There is no tenderness.   Musculoskeletal: He exhibits tenderness.        Lumbar back: He exhibits decreased range of motion, tenderness and spasm. He exhibits no bony tenderness, no deformity, no laceration and normal pulse.        Back:    Neurological: He is alert and oriented to person, place, and time. He has normal reflexes. He displays normal reflexes. He exhibits normal muscle tone.   Skin: Skin is warm and dry. No rash noted. He is not diaphoretic. No erythema. No pallor.   Psychiatric: He has a normal mood and affect. His behavior is normal.   Nursing note and vitals reviewed.        MDM and ED Course     ED Medication Orders      Start Ordered     Status Ordering Provider    01/01/16 1122 01/01/16 1121  HYDROcodone-acetaminophen (NORCO) 5-325 MG per tablet 1 tablet   Once in ED     Route: Oral  Ordered Dose: 1 tablet     Last MAR action:  Given Chucky May    01/01/16 1121 01/01/16 1121  cyclobenzaprine (FLEXERIL) tablet 10 mg   3 times daily PRN     Route: Oral  Ordered Dose: 10 mg     Last MAR action:  Given Chucky May           Radiologic Studies  Radiology Results (24 Hour)     Procedure Component Value Units Date/Time    XR Lumbar Spine AP And Lateral [[id number]] Collected:  01/01/16 1115    Order Status:  Completed Updated:  01/01/16 1117    Narrative:      Clinical History:  Reason For Exam: Injury  Patient is having back pain after falling off ladder this morning. Pain is running into right leg.     Examination:  AP and lateral views of the lumbar spine.    Comparison:  None available.    Findings:  There is no plain film evidence of fracture. Lumbar alignment is normal. The disc spaces appear maintained. The visualized bony pelvis appears intact. The overlying soft tissues are normal in appearance.      Impression:      Normal lumbar spine.    ReadingStation:[id number]      .          MDM  Number of Diagnoses or Management Options  Right sided sciatica:             Procedures    Clinical Impression & Disposition     Clinical Impression  Final diagnoses:   Right sided sciatica        ED Disposition     Discharge Lyndon Chapel discharge to home/self care.    Condition at disposition: Stable             New Prescriptions    CYCLOBENZAPRINE (FLEXERIL) 10 MG TABLET    Take 1 tablet (10 mg total) by mouth 3 (three) times daily as needed for Muscle spasms.    HYDROCODONE-ACETAMINOPHEN (NORCO) 5-325  MG PER TABLET    Take 1 tablet by mouth every 6 (six) hours as needed for Pain.                   Chucky May, MD  01/01/16 [phone]

## 2016-01-01 NOTE — ED Notes (Signed)
Patient fell approx 10 ft from ladder this am c/o right-sided low back pain radiating down right leg - patient history of sciatica - right side.  Denies LOC

## 2016-06-08 ENCOUNTER — Emergency Department
Admission: EM | Admit: 2016-06-08 | Discharge: 2016-06-08 | Disposition: A | Discharge status: Home / Self Care | Payer: Self-pay | Attending: General Practice | Admitting: General Practice

## 2016-06-08 ENCOUNTER — Emergency Department: Payer: Self-pay

## 2016-06-08 DIAGNOSIS — W1789XA Other fall from one level to another, initial encounter: Secondary | ICD-10-CM | POA: Insufficient documentation

## 2016-06-08 DIAGNOSIS — M5441 Lumbago with sciatica, right side: Secondary | ICD-10-CM | POA: Insufficient documentation

## 2016-06-08 MED ORDER — NAPROXEN 250 MG PO TABS
250.0000 mg | ORAL_TABLET | Freq: Two times a day (BID) | ORAL | 0 refills | Status: DC
Start: 2016-06-08 — End: 2019-12-05

## 2016-06-08 NOTE — Discharge Instructions (Signed)
Back Pain (Acute or Chronic)    Back pain is one of the most common problems. The good news is that most people feel better in 1 to 2 weeks, and most of the rest in 1 to 2 months. Most people can remain active.  People experience and describe pain differently; not everyone is the same.   The pain can be sharp, stabbing, shooting, aching, cramping or burning.   Movement, standing, bending, lifting, sitting, or walking may worsen pain.   It can be localized to one spot or area, or it can be more generalized.   It can spread or radiate upwards, to the front, or go down your arms or legs (sciatica).   It can cause muscle spasm.  Most of the time, mechanical problems with the musclesor spine cause the pain. Mechanical problemsare usually caused by an injury to the muscles or ligaments. While illness can cause back pain, it is usually not caused by a serious illness. Mechanical problems include:   Physical activity such as sports, exercise, work, or normal activity   Overexertion, lifting, pushing, pulling incorrectly or too aggressively   Sudden twisting, bending, or stretching from an accident, or accidental movement   Poor posture   Stretching or moving wrong, without noticing pain at the time   Poor coordination, lack of regular exercise (check with your doctor about this)   Spinal disc disease or arthritis   Stress  Pain can also be related to pregnancy, or illness like appendicitis, bladder or kidney infections, pelvic infections, and many other things.  Acute back pain usually gets better in1 to 2 weeks. Back pain related to disk disease, arthritis in the spinal joints or spinal stenosis (narrowing of the spinal canal) can become chronic and last for months or years.  Unless you had a physical injury (for example, a car accident or fall) X-rays are usually not needed for the initial evaluation of back pain. If pain continues and does not respond to medical treatment, X-rays and other tests may be  needed.  Home care  Try these home care recommendations:   When in bed, tryto find a position of comfort. A firm mattress is best. Try lying flat on your back with pillows under your knees. You can also try lying on your side with your knees bent up towards your chest and a pillow between your knees.   At first, do not try to stretch out the sore spots. If there is a strain, it is not like the good soreness you get after exercising without an injury. In this case, stretching may make it worse.   Avoid prolong sitting, long car rides, or travel. This puts more stress on the lower back than standing or walking.   During the first 24 to 72 hours after an acute injury or flare up of chronic back pain, apply an ice pack to the painful area for 20 minutes and then remove it for 20 minutes. Do this over a period of 60 to 90 minutes or several times a day. This will reduce swelling and pain. Wrap the ice pack in a thin towel or plastic to protect your skin.   You can start with ice, then switch to heat. Heat (hot shower, hot bath, or heating pad) reduces pain and works well for muscle spasms. Heat can be applied to the painful area for 20 minutes then remove it for 20 minutes. Do this over a period of 60 to 90 minutes or several times   a day. Do not sleep on a heating pad. It can lead to skin burns or tissue damage.   You can alternate ice and heat therapy. Talk with your doctor aboutthe best treatment for your back pain.   Therapeutic massage can help relax the back muscles without stretching them.   Be aware of safe lifting methods and do not lift anything without stretching first.  Medicines  Talk to your doctor before using medicine, especially if you have other medical problems or are taking other medicines.   You may use over-the-counter medicine as directed on the bottle to control pain, unless another pain medicine was prescribed. If you have chronic conditions like diabetes, liver or kidney disease,  stomach ulcers, or gastrointestinal bleeding, or are taking blood thinners, talk to your doctor before taking any medicine.   Be careful if you are given a prescription medicines, narcotics, or medicine for muscle spasms. They can cause drowsiness, affect your coordination, reflexes, and judgement. Do not drive or operate heavy machinery.  Follow-up care  Follow up with your healthcare provider, or as advised.  A radiologist will review any X-rays that were taken. Your provide will notify you of any new findings that may affect your care.  Call 911  Call emergency services if any of the following occur:   Trouble breathing   Confusion   Very drowsy or trouble awakening   Fainting or loss of consciousness   Rapid or very slow heart rate   Loss of bowel or bladder control  When to seek medical advice  Call your healthcare provider right away if any of these occur:   Pain becomes worse or spreads to your legs   Weakness or numbness in one or both legs   Numbness in the groin or genital area  Date Last Reviewed: 02/07/2015   2000-2016 [employer]. [address], [place] [address]. All rights reserved. This information is not intended as a substitute for professional medical care. Always follow your healthcare professional's instructions.

## 2016-06-08 NOTE — ED Triage Notes (Signed)
FELL APPROX 5 FEET OFF A PLATFORM BACKWARDS AND LANDED ON RT LEG, C/O RT LOWER BACK PAIN.

## 2016-06-08 NOTE — ED Provider Notes (Signed)
Physician/Midlevel provider first contact with patient: 06/08/16 3086         History     Chief Complaint   Patient presents with   . Back Pain     RT LOWER   . Leg Injury     RT     HPI   30 year old male came to emergency room complaining of lower back and right hip pain. Patient fell approximately 5 feet off a PlatForm and landed on his right hip. Incident happened last night. Pain is sharp and radiates to his right thigh. Patient has history of sciatica and he is under care of an orthopedic surgeon at [facility]. No GI or GU symptoms. Pain is predominantly to his right back.  Nursing (triage) note reviewed for the following pertinent information:  FELL APPROX 5 FEET OFF A PLATFORM BACKWARDS AND LANDED ON RT LEG, C/O RT LOWER BACK PAIN.    Past Medical History:   Diagnosis Date   . Lumbar back pain        No past surgical history on file.    No family history on file.    Social  Social History   Substance Use Topics   . Smoking status: Current Every Day Smoker     Packs/day: 0.50     Types: Cigarettes   . Smokeless tobacco: Not on file   . Alcohol use No       .     No Known Allergies    Home Medications     Med List Status:  In Progress Set By: Marjo Bicker, RN at 06/08/2016  9:23 AM                acetaminophen (TYLENOL) 500 MG tablet     Take 500 mg by mouth.     cyclobenzaprine (FLEXERIL) 10 MG tablet     Take 1 tablet (10 mg total) by mouth 3 (three) times daily as needed for Muscle spasms.     cyclobenzaprine (FLEXERIL) 10 MG tablet     Take 1 tablet (10 mg total) by mouth 3 (three) times daily as needed for Muscle spasms.     HYDROcodone-acetaminophen (NORCO) 5-325 MG per tablet     Take 1 tablet by mouth every 6 (six) hours as needed for Pain.     ibuprofen (ADVIL,MOTRIN) 100 MG tablet     Take 100 mg by mouth every 6 (six) hours as needed for Fever.           Review of Systems   Musculoskeletal: Positive for back pain.   All other systems reviewed and are negative.      Physical Exam    BP: 121/78,  Heart Rate: 81, Temp: 97.9 F (36.6 C), Resp Rate: 16, SpO2: 99 %, Weight: 63.5 kg    Physical Exam   Constitutional: He is oriented to person, place, and time. He appears well-developed and well-nourished. No distress.   HENT:   Head: Normocephalic.   Right Ear: External ear normal.   Left Ear: External ear normal.   Nose: Nose normal.   Mouth/Throat: Oropharynx is clear and moist. No oropharyngeal exudate.   Neck: Normal range of motion. Neck supple. No JVD present. No tracheal deviation present. No thyromegaly present.   Cardiovascular: Normal rate, regular rhythm and normal heart sounds.    Pulmonary/Chest: Effort normal and breath sounds normal. No stridor. No respiratory distress. He has no wheezes. He has no rales. He exhibits no tenderness.   Abdominal: Soft.  Bowel sounds are normal. He exhibits no distension and no mass. There is no tenderness. There is no rebound and no guarding. No hernia.   Musculoskeletal: Normal range of motion. He exhibits tenderness. He exhibits no edema or deformity.   Tender right paravertebral area L1-S1. Also tenderness right hip area. No limitation of movement.   Lymphadenopathy:     He has no cervical adenopathy.   Neurological: He is alert and oriented to person, place, and time.   Skin: Skin is warm. No rash noted. He is not diaphoretic. No erythema. No pallor.   Nursing note and vitals reviewed.        MDM and ED Course     ED Medication Orders     None             MDM  Reviewed x-ray results with patient and patient was advised to take Naprosyn 250 mg 1 tablet twice a day. Rest see family physician for follow-up. Return to emergency room if needed.    ED Course              Procedures    Clinical Impression & Disposition     Clinical Impression  Final diagnoses:   Left-sided low back pain with right-sided sciatica, unspecified chronicity        ED Disposition     ED Disposition Condition Date/Time Comment    Discharge  Tue Jun 08, 2016 10:59 AM Genelle Bal  discharge to home/self care.    Condition at disposition: Stable           New Prescriptions    NAPROXEN (NAPROSYN) 250 MG TABLET    Take 1 tablet (250 mg total) by mouth 2 (two) times daily with meals.                 Sarthak Rubenstein, Jean Rosenthal, MD  06/08/16 1100

## 2016-07-12 ENCOUNTER — Emergency Department: Payer: Self-pay

## 2016-07-12 ENCOUNTER — Emergency Department
Admission: EM | Admit: 2016-07-12 | Discharge: 2016-07-12 | Payer: Self-pay | Attending: General Practice | Admitting: General Practice

## 2016-07-12 DIAGNOSIS — R51 Headache: Secondary | ICD-10-CM | POA: Insufficient documentation

## 2016-07-12 DIAGNOSIS — S0181XA Laceration without foreign body of other part of head, initial encounter: Secondary | ICD-10-CM | POA: Insufficient documentation

## 2016-07-12 DIAGNOSIS — M542 Cervicalgia: Secondary | ICD-10-CM | POA: Insufficient documentation

## 2016-07-12 MED ORDER — CEPHALEXIN 500 MG PO CAPS
500.0000 mg | ORAL_CAPSULE | Freq: Four times a day (QID) | ORAL | 0 refills | Status: AC
Start: 2016-07-12 — End: 2016-07-19

## 2016-07-12 NOTE — Discharge Instructions (Signed)
Face Laceration: Suture or Tape  Alaceration is a cut through the skin. This will require stitches if it is deep. Minor cuts may be treated with surgical tape.    Home care   Your healthcare provider may prescribe an antibiotic. This is to help prevent infection. Follow all instructions for taking this medicine. Take the medicine every day until it is gone or you are told to stop. You should not have any left over.   The healthcare provider may prescribe medicines for pain. Follow instructions for taking them.   Follow the healthcare provider's instructions on how to care for the cut.   Wash your hands with soap and warm water before and after caring for the cut. This helps prevent infection.   If a bandage was applied and it becomes wet or dirty, replace it. Otherwise, leave it in place for the first 24 hours, then change it once a day or as directed.   If sutures were used, clean the wound daily:   After removing the bandage, wash the area with soap and water. Use a wet cotton swab to loosen and remove any blood or crust that forms.   After cleaning, keep the wound clean and dry. Talk with your doctor before applying any antibiotic ointment to the wound. Reapply a fresh bandage.   You may remove the bandage to shower as usual after the first 24 hours, but do not soak the area in water (no swimming) until the sutures are removed.   If surgical tape was used, keep the area clean and dry. If it becomes wet, blot it dry with a towel.   Most facialskin wounds heal without problems. However, an infection sometimes occurs despite proper treatment. Therefore, watch for the signs of infection listed below.  Follow-up care  Follow up with your healthcare provider as advised.Be sure to return for suture removal as directed.Ask your provider how long sutures should remain in place. If surgical tape closures were used, you may remove them yourself when your provider recommendsif they have not fallen off on  their own.  When to seek medical advice  Call your healthcare provider right awayif any of these occur:   Wound bleeding not controlled by direct pressure   Signs of infection, including increasing pain in the wound, increasing wound redness or swelling, or pus or bad odor coming from the wound   Fever of100.4F (38C)or higher or as directed by your healthcare provider   Stitches come apart or fall out or surgical tape falls off before 5 days   Wound edges re-open   Wound changes colors   Numbness around the wound  Date Last Reviewed: 01/20/2014   2000-2016 [employer] [address], [place] [address]. All rights reserved. This information is not intended as a substitute for professional medical care. Always follow your healthcare professional's instructions.
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
## 2016-07-12 NOTE — ED Triage Notes (Signed)
Officer remains at bedside  

## 2017-02-03 ENCOUNTER — Emergency Department: Payer: Medicaid - Out of State

## 2017-02-03 ENCOUNTER — Emergency Department
Admission: EM | Admit: 2017-02-03 | Discharge: 2017-02-03 | Disposition: A | Discharge status: Home / Self Care | Payer: Medicaid - Out of State | Attending: Emergency Medicine | Admitting: Emergency Medicine

## 2017-02-03 DIAGNOSIS — F17200 Nicotine dependence, unspecified, uncomplicated: Secondary | ICD-10-CM | POA: Insufficient documentation

## 2017-02-03 DIAGNOSIS — N200 Calculus of kidney: Secondary | ICD-10-CM | POA: Insufficient documentation

## 2017-02-03 DIAGNOSIS — N23 Unspecified renal colic: Secondary | ICD-10-CM | POA: Insufficient documentation

## 2017-02-03 HISTORY — DX: Calculus of kidney: N20.0

## 2017-02-03 LAB — URINE DRUG SCREEN, QUALITATIVE (ARMC ONLY)
AMPHETAMINES, UR SCREEN: NOT DETECTED
BENZODIAZEPINE, UR SCRN: NOT DETECTED
Barbiturates, Ur Screen: NOT DETECTED
Cannabinoid 50 Ng, Ur ~~LOC~~: NOT DETECTED
Cocaine Metabolite,Ur ~~LOC~~: NOT DETECTED
MDMA (ECSTASY) UR SCREEN: NOT DETECTED
Methadone Scn, Ur: NOT DETECTED
OPIATE, UR SCREEN: NOT DETECTED
PHENCYCLIDINE (PCP) UR S: NOT DETECTED
Tricyclic, Ur Screen: POSITIVE — AB

## 2017-02-03 LAB — URINALYSIS, COMPLETE (UACMP) WITH MICROSCOPIC
Bacteria, UA: NONE SEEN
Bilirubin Urine: NEGATIVE
GLUCOSE, UA: NEGATIVE mg/dL
KETONES UR: NEGATIVE mg/dL
LEUKOCYTES UA: NEGATIVE
Nitrite: POSITIVE — AB
PH: 7 (ref 5.0–8.0)
Protein, ur: NEGATIVE mg/dL
Specific Gravity, Urine: 1.017 (ref 1.005–1.030)

## 2017-02-03 LAB — COMPREHENSIVE METABOLIC PANEL
ALK PHOS: 48 U/L (ref 38–126)
ALT: 15 U/L — AB (ref 17–63)
AST: 27 U/L (ref 15–41)
Albumin: 4 g/dL (ref 3.5–5.0)
Anion gap: 8 (ref 5–15)
BILIRUBIN TOTAL: 1 mg/dL (ref 0.3–1.2)
BUN: 16 mg/dL (ref 6–20)
CALCIUM: 9.1 mg/dL (ref 8.9–10.3)
CO2: 25 mmol/L (ref 22–32)
CREATININE: 1.15 mg/dL (ref 0.61–1.24)
Chloride: 106 mmol/L (ref 101–111)
Glucose, Bld: 97 mg/dL (ref 65–99)
Potassium: 3.5 mmol/L (ref 3.5–5.1)
Sodium: 139 mmol/L (ref 135–145)
Total Protein: 6.6 g/dL (ref 6.5–8.1)

## 2017-02-03 LAB — CBC
HCT: 40.6 % (ref 40.0–52.0)
Hemoglobin: 14.2 g/dL (ref 13.0–18.0)
MCH: 30.8 pg (ref 26.0–34.0)
MCHC: 35 g/dL (ref 32.0–36.0)
MCV: 88.2 fL (ref 80.0–100.0)
PLATELETS: 264 10*3/uL (ref 150–440)
RBC: 4.61 MIL/uL (ref 4.40–5.90)
RDW: 13.4 % (ref 11.5–14.5)
WBC: 12.2 10*3/uL — AB (ref 3.8–10.6)

## 2017-02-03 LAB — LIPASE, BLOOD: Lipase: 18 U/L (ref 11–51)

## 2017-02-03 MED ORDER — KETOROLAC TROMETHAMINE 30 MG/ML IJ SOLN
15.0000 mg | Freq: Once | INTRAMUSCULAR | Status: AC
  Administered 2017-02-03: 15 mg via INTRAVENOUS
  Filled 2017-02-03: qty 1

## 2017-02-03 MED ORDER — ONDANSETRON HCL 4 MG PO TABS: 4.0000 mg | ORAL_TABLET | Freq: Three times a day (TID) | ORAL | 0 refills | Status: AC | PRN

## 2017-02-03 MED ORDER — IBUPROFEN 200 MG PO TABS
600.0000 mg | ORAL_TABLET | Freq: Four times a day (QID) | ORAL | 0 refills | Status: AC | PRN
Start: 2017-02-03 — End: ?

## 2017-02-03 MED ORDER — TAMSULOSIN HCL 0.4 MG PO CAPS: 0.4000 mg | ORAL_CAPSULE | Freq: Every day | ORAL | 0 refills | Status: DC

## 2017-02-03 MED ORDER — OXYCODONE-ACETAMINOPHEN 5-325 MG PO TABS: 1.0000 | ORAL_TABLET | ORAL | 0 refills | Status: DC | PRN

## 2017-02-03 MED ORDER — SODIUM CHLORIDE 0.9 % IV BOLUS (SEPSIS)
1000.0000 mL | Freq: Once | INTRAVENOUS | Status: AC
  Administered 2017-02-03: 1000 mL via INTRAVENOUS

## 2017-02-03 MED ORDER — CEPHALEXIN 500 MG PO CAPS: 500.0000 mg | ORAL_CAPSULE | Freq: Three times a day (TID) | ORAL | 0 refills | Status: AC

## 2017-02-03 NOTE — Discharge Instructions (Signed)
If you have increased pain, fever, vomiting, or you feel worse in any way, return to the emergency department.  Do not fail to followup with urologist above, as this stone may not pass on its own.

## 2017-02-03 NOTE — ED Notes (Signed)
Pt discharged to home.  Family member driving.  Discharge instructions reviewed.  Verbalized understanding.  No questions or concerns at this time.  Teach back verified.  Pt in NAD.  No items left in ED.   

## 2017-02-03 NOTE — ED Triage Notes (Signed)
Pt brought in by [facility] with c/o RLQ pain that radiates to R flank and R side.  Pt states that he has hx of kidney stones.  Pt states that his pain is different.  Pt states that he took ibuprofen with no relief.  Pt rates pain at 8/10.  Pt reports some nausea as well.  Pt A&Ox4.

## 2017-02-03 NOTE — ED Provider Notes (Addendum)
[facility] Emergency Department Provider Note  ____________________________________________   I have reviewed the triage vital signs and the nursing notes.   HISTORY  Chief Complaint Abdominal Pain    HPI Barry Walsh is a 31 y.o. male he states he has had kidney stones in the past however he has never been to this hospital before, goes [facility], and sudden onset right flank pain radiating to the right groin which states is different from prior kidney stones although he cannot describe. No fever, nausea but no vomiting. Started this 26 exam. Nothing was about a nothing makes it worse.     Past Medical History:  Diagnosis Date  . Kidney stone     There are no active problems to display for this patient.   History reviewed. No pertinent surgical history.  Prior to Admission medications   Not on File    Allergies Patient has no known allergies.  No family history on file.  Social History Social History  Substance Use Topics  . Smoking status: Current Every Day Smoker  . Smokeless tobacco: Never Used  . Alcohol use No    Review of Systems Constitutional: No fever/chills Eyes: No visual changes. ENT: No sore throat. No stiff neck no neck pain Cardiovascular: Denies chest pain. Respiratory: Denies shortness of breath. Gastrointestinal:   no vomiting.  No diarrhea.  No constipation. Genitourinary: Negative for dysuria. Musculoskeletal: Negative lower extremity swelling Skin: Negative for rash. Neurological: Negative for severe headaches, focal weakness or numbness.   ____________________________________________   PHYSICAL EXAM:  VITAL SIGNS: ED Triage Vitals  Enc Vitals Group     BP 02/03/17 1546 (!) 132/91     Pulse Rate 02/03/17 1546 71     Resp 02/03/17 1546 18     Temp 02/03/17 1546 98.3 F (36.8 C)     Temp Source 02/03/17 1546 Oral     SpO2 02/03/17 1546 99 %     Weight 02/03/17 1547 145 lb (65.8 kg)      Height 02/03/17 1547 5' 9.5" (1.765 m)     Head Circumference --      Peak Flow --      Pain Score 02/03/17 1546 8     Pain Loc --      Pain Edu? --      Excl. in GC? --     Constitutional: Alert and oriented. Well appearing and in no acute distress. Eyes: Conjunctivae are normal Head: Atraumatic HEENT: No congestion/rhinnorhea. Mucous membranes are moist.  Oropharynx non-erythematous Neck:   Nontender with no meningismus, no masses, no stridor Cardiovascular: Normal rate, regular rhythm. Grossly normal heart sounds.  Good peripheral circulation. Respiratory: Normal respiratory effort.  No retractions. Lungs CTAB. Abdominal: Soft and nontender. No distention. No guarding no rebound Back:  There is no focal tenderness or step off.  there is no midline tenderness there are no lesions noted. there is right CVA tenderness  Musculoskeletal: No lower extremity tenderness, no upper extremity tenderness. No joint effusions, no DVT signs strong distal pulses no edema Neurologic:  Normal speech and language. No gross focal neurologic deficits are appreciated.  Skin:  Skin is warm, dry and intact. No rash noted. Psychiatric: Mood and affect are normal. Speech and behavior are normal.  ____________________________________________   LABS (all labs ordered are listed, but only abnormal results are displayed)  Labs Reviewed  COMPREHENSIVE METABOLIC PANEL - Abnormal; Notable for the following:       Result Value  ALT 15 (*)    All other components within normal limits  CBC - Abnormal; Notable for the following:    WBC 12.2 (*)    All other components within normal limits  LIPASE, BLOOD  URINALYSIS, COMPLETE (UACMP) WITH MICROSCOPIC   ____________________________________________  EKG  I personally interpreted any EKGs ordered by me or triage  ____________________________________________  RADIOLOGY  I reviewed any imaging ordered by me or triage that were performed during my shift  and, if possible, patient and/or family made aware of any abnormal findings. ____________________________________________   PROCEDURES  Procedure(s) performed: None  Procedures  Critical Care performed: None  ____________________________________________   INITIAL IMPRESSION / ASSESSMENT AND PLAN / ED COURSE  Pertinent labs & imaging results that were available during my care of the patient were reviewed by me and considered in my medical decision making (see chart for details).  Medical history stone, no evidence of infection, renal function is preserved. Given a pain medication which has helped him, IV fluid, patient will need outpatient follow-up for 6 mm stone in the UVJ. Vital signs are reassuring. We're waiting urinalysis per   ----------------------------------------- 6:49 PM on 02/03/2017 -----------------------------------------  Urinalysis shows nitrite positive however no indication for urinary tract infection no fever, moderate white count consistent with kidney stone, no bacteria and no white cells etc. I did discuss with Dr. Sherryl BartersBudzyn of urology, who feels that this likely is not infected. Patient did take Azo this morning which likely is affecting our reads and this is the laboratories opinion as well. However I will start him on Keflex pending urine culture, we'll give him pain control he is doing much better after Toradol. Urology agrees with this plan. Close follow-up and return precautions given and understood. ____________________________________________   FINAL CLINICAL IMPRESSION(S) / ED DIAGNOSES  Final diagnoses:  None      This chart was dictated using voice recognition software.  Despite best efforts to proofread,  errors can occur which can change meaning.      Jeanmarie PlantMcShane, Makenzey Nanni A, MD 02/03/17 1705    Jeanmarie PlantMcShane, Tamberlyn Midgley A, MD 02/03/17 1850    Jeanmarie PlantMcShane, Mileah Hemmer A, MD 02/03/17 [phone]

## 2017-02-03 NOTE — ED Notes (Signed)
Pt also states that he took 2 AZO to see if it would relieve pain, but no relief.

## 2017-02-05 LAB — URINE CULTURE: Culture: <10000 — AB

## 2017-02-08 ENCOUNTER — Ambulatory Visit (INDEPENDENT_AMBULATORY_CARE_PROVIDER_SITE_OTHER): Payer: Self-pay | Admitting: Urology

## 2017-02-08 ENCOUNTER — Encounter: Payer: Self-pay | Admitting: Urology

## 2017-02-08 VITALS — BP 131/76 | HR 105 | Ht 69.5 in | Wt 147.2 lb

## 2017-02-08 DIAGNOSIS — N201 Calculus of ureter: Secondary | ICD-10-CM

## 2017-02-08 LAB — URINALYSIS, COMPLETE
Bilirubin, UA: NEGATIVE
Glucose, UA: NEGATIVE
Ketones, UA: NEGATIVE
LEUKOCYTES UA: NEGATIVE
NITRITE UA: NEGATIVE
PH UA: 7.5 (ref 5.0–7.5)
Protein, UA: NEGATIVE
Specific Gravity, UA: 1.015 (ref 1.005–1.030)
Urobilinogen, Ur: 0.2 mg/dL (ref 0.2–1.0)

## 2017-02-08 LAB — MICROSCOPIC EXAMINATION

## 2017-02-08 NOTE — Progress Notes (Signed)
02/08/2017 11:59 AM   Remi DeterJonathan Allor Dec 10, 1985 [id number]  Referring provider: No referring provider defined for this encounter.  Chief Complaint  Patient presents with  . Nephrolithiasis    HPI: Consultation for right ureteral stone. Patient developed right flank pain 02/03/2017 and underwent a CT scan of the abdomen and pelvis. This revealed a 6 mm stone at the right ureterovesical junction working its way into the bladder. It may be visible on the scout. There were a couple of tiny 1 mm pieces of stone above it. No stones in the kidney. A 1 mm stone possibly in the left kidney. I reviewed all the images. At the time his UA showed 6-30 red blood cells, creatinine 1.15 and urine culture grew less than 10,000 colonies insignificant growth.  He's been taking cephalexin and tamsulosin. He has not seen a stone pass. He still gets crampy RLQ pain that seems to radiate to his back. He has no gross hematuria. No dysuria. No fever.   He has had one prior stone on the left. It passed a couple of years ago. He recently moved from [place].   Patient's UA today reveals 0-5 white cells, 0-2 red cells, few bacteria.    PMH: Past Medical History:  Diagnosis Date  . Anxiety   . Arthritis   . Kidney stone     Surgical History: History reviewed. No pertinent surgical history.  Home Medications:  Allergies as of 02/08/2017   No Known Allergies     Medication List       Accurate as of 02/08/17 11:59 AM. Always use your most recent med list.          cephALEXin 500 MG capsule Commonly known as:  KEFLEX Take 1 capsule (500 mg total) by mouth 3 (three) times daily.   ibuprofen 200 MG tablet Commonly known as:  MOTRIN IB Take 3 tablets (600 mg total) by mouth every 6 (six) hours as needed.   ondansetron 4 MG tablet Commonly known as:  ZOFRAN Take 1 tablet (4 mg total) by mouth every 8 (eight) hours as needed for nausea or vomiting.   oxyCODONE-acetaminophen 5-325 MG tablet Commonly  known as:  ROXICET Take 1 tablet by mouth every 4 (four) hours as needed for severe pain.   tamsulosin 0.4 MG Caps capsule Commonly known as:  FLOMAX Take 1 capsule (0.4 mg total) by mouth daily.       Allergies: No Known Allergies  Family History: Family History  Problem Relation Age of Onset  . Bladder Cancer Neg Hx   . Prostate cancer Neg Hx   . Kidney cancer Neg Hx     Social History:  reports that he has been smoking.  He has never used smokeless tobacco. He reports that he does not drink alcohol or use drugs.  ROS: UROLOGY Frequent Urination?: Yes Hard to postpone urination?: No Burning/pain with urination?: Yes Get up at night to urinate?: Yes Leakage of urine?: No Urine stream starts and stops?: Yes Trouble starting stream?: Yes Do you have to strain to urinate?: No Blood in urine?: Yes Urinary tract infection?: Yes Sexually transmitted disease?: No Injury to kidneys or bladder?: No Painful intercourse?: No Weak stream?: No Erection problems?: No Penile pain?: Yes  Gastrointestinal Nausea?: Yes Vomiting?: Yes Indigestion/heartburn?: No Diarrhea?: No Constipation?: No  Constitutional Fever: Yes Night sweats?: No Weight loss?: No Fatigue?: No  Skin Skin rash/lesions?: No Itching?: No  Eyes Blurred vision?: No Double vision?: No  Ears/Nose/Throat Sore throat?: No  Sinus problems?: No  Hematologic/Lymphatic Swollen glands?: No Easy bruising?: No  Cardiovascular Leg swelling?: No Chest pain?: No  Respiratory Cough?: No Shortness of breath?: No  Endocrine Excessive thirst?: No  Musculoskeletal Back pain?: Yes Joint pain?: No  Neurological Headaches?: No Dizziness?: No  Psychologic Depression?: No Anxiety?: No  Physical Exam: BP 131/76 (BP Location: Left Arm, Patient Position: Sitting, Cuff Size: Normal)   Pulse (!) 105   Ht 5' 9.5" (1.765 m)   Wt 66.8 kg (147 lb 3.2 oz)   BMI 21.43 kg/m   Constitutional:  Alert and  oriented, No acute distress. HEENT: [place] AT, moist mucus membranes.  Trachea midline, no masses. Cardiovascular: No clubbing, cyanosis, or edema. Respiratory: Normal respiratory effort, no increased work of breathing. GI: Abdomen is soft, nontender, nondistended, no abdominal masses GU: No CVA tenderness Skin: No rashes, bruises or suspicious lesions. Lymph: No cervical or inguinal adenopathy. Neurologic: Grossly intact, no focal deficits, moving all 4 extremities. Psychiatric: Normal mood and affect.  Laboratory Data: Lab Results  Component Value Date   WBC 12.2 (H) 02/03/2017   HGB 14.2 02/03/2017   HCT 40.6 02/03/2017   MCV 88.2 02/03/2017   PLT 264 02/03/2017    Lab Results  Component Value Date   CREATININE 1.15 02/03/2017    No results found for: PSA  No results found for: TESTOSTERONE  No results found for: HGBA1C  Urinalysis    Component Value Date/Time   COLORURINE AMBER (A) 02/03/2017 1546   APPEARANCEUR CLEAR (A) 02/03/2017 1546   LABSPEC 1.017 02/03/2017 1546   PHURINE 7.0 02/03/2017 1546   GLUCOSEU NEGATIVE 02/03/2017 1546   HGBUR SMALL (A) 02/03/2017 1546   BILIRUBINUR NEGATIVE 02/03/2017 1546   KETONESUR NEGATIVE 02/03/2017 1546   PROTEINUR NEGATIVE 02/03/2017 1546   NITRITE POSITIVE (A) 02/03/2017 1546   LEUKOCYTESUR NEGATIVE 02/03/2017 1546    Pertinent Imaging: CT  Assessment & Plan:    1. Ureteral stone - pain controlled and mild. Stone may have passed. Will check a KUB. Otherwise discussed nature r/b of URS vs ESWL. He'll consider. Depending on KUB, we might have him return in 1-2 weeks with another KUB or a renal US to r/o hydro.   - Urinalysis, Complete   No Follow-up on file.  Jerilee Field, MD  [facility] [address] [place], [place] [address] [phone]

## 2017-02-11 ENCOUNTER — Telehealth: Payer: Self-pay | Admitting: Urology

## 2017-02-11 NOTE — Telephone Encounter (Signed)
Pt is in a lot of pain and wants to know if he should go to ER or not.  Please give pt a call ([phone]

## 2017-02-22 NOTE — Progress Notes (Signed)
02/23/2017 4:10 PM   Barry DeterJonathan Walsh 1985-11-11 [id number]  Referring provider: No referring provider defined for this encounter.  Chief Complaint  Patient presents with  . Results    KUB  . Follow-up    3 week ureteral stone    HPI: 31 yo WM who presents today for a follow up for a right ureteral stone.  Background history Consultation for right ureteral stone. Patient developed right flank pain 02/03/2017 and underwent a CT scan of the abdomen and pelvis. This revealed a 6 mm stone at the right ureterovesical junction working its way into the bladder. It may be visible on the scout. There were a couple of tiny 1 mm pieces of stone above it. No stones in the kidney. A 1 mm stone possibly in the left kidney. I reviewed all the images. At the time his UA showed 6-30 red blood cells, creatinine 1.15 and urine culture grew less than 10,000 colonies insignificant growth.  He's been taking cephalexin and tamsulosin. He has not seen a stone pass. He still gets crampy RLQ pain that seems to radiate to his back. He has no gross hematuria. No dysuria. No fever.  He has had one prior stone on the left. It passed a couple of years ago. He recently moved from [place].   Patient's UA reveals 0-5 white cells, 0-2 red cells, few bacteria.   Patient states he was having no symptoms of renal colic until last evening and this morning.  The pain is located in the right flank and radiating to his right side.  He is having some nausea and vomiting associated with the pain.  He states the pain is made worse with straightening of his back.  He is having associated frequency and urgency.   He is not having gross hematuria.  He has not passed a stone fragment.  KUB taken today did not demonstrate any calculi.     PMH: Past Medical History:  Diagnosis Date  . Anxiety   . Arthritis   . Kidney stone     Surgical History: History reviewed. No pertinent surgical history.  Home Medications:  Allergies as of  02/23/2017   No Known Allergies     Medication List       Accurate as of 02/23/17  4:10 PM. Always use your most recent med list.          ibuprofen 200 MG tablet Commonly known as:  MOTRIN IB Take 3 tablets (600 mg total) by mouth every 6 (six) hours as needed.   ondansetron 4 MG tablet Commonly known as:  ZOFRAN Take 1 tablet (4 mg total) by mouth every 8 (eight) hours as needed for nausea or vomiting.   oxyCODONE-acetaminophen 5-325 MG tablet Commonly known as:  ROXICET Take 1 tablet by mouth every 4 (four) hours as needed for severe pain.   tamsulosin 0.4 MG Caps capsule Commonly known as:  FLOMAX Take 1 capsule (0.4 mg total) by mouth daily.       Allergies: No Known Allergies  Family History: Family History  Problem Relation Age of Onset  . Bladder Cancer Neg Hx   . Prostate cancer Neg Hx   . Kidney cancer Neg Hx     Social History:  reports that he has been smoking.  He has never used smokeless tobacco. He reports that he does not drink alcohol or use drugs.  ROS: UROLOGY Frequent Urination?: Yes Hard to postpone urination?: Yes Burning/pain with urination?: No Get up at  night to urinate?: No Leakage of urine?: No Urine stream starts and stops?: No Trouble starting stream?: No Do you have to strain to urinate?: No Blood in urine?: Yes Urinary tract infection?: No Sexually transmitted disease?: No Injury to kidneys or bladder?: No Painful intercourse?: No Weak stream?: No Erection problems?: No Penile pain?: No  Gastrointestinal Nausea?: Yes Vomiting?: Yes Indigestion/heartburn?: No Diarrhea?: No Constipation?: No  Constitutional Fever: No Night sweats?: No Weight loss?: No Fatigue?: No  Skin Skin rash/lesions?: No Itching?: No  Eyes Blurred vision?: No Double vision?: No  Ears/Nose/Throat Sore throat?: No Sinus problems?: No  Hematologic/Lymphatic Swollen glands?: No Easy bruising?: No  Cardiovascular Leg swelling?:  No Chest pain?: No  Respiratory Cough?: No Shortness of breath?: No  Endocrine Excessive thirst?: No  Musculoskeletal Back pain?: Yes Joint pain?: No  Neurological Headaches?: No Dizziness?: No  Psychologic Depression?: No Anxiety?: No  Physical Exam: BP 133/84   Pulse 88   Ht 5\' 10"  (1.778 m)   Wt 146 lb 11.2 oz (66.5 kg)   BMI 21.05 kg/m   Constitutional:  Alert and oriented, No acute distress. HEENT: [place] AT, moist mucus membranes.  Trachea midline, no masses. Cardiovascular: No clubbing, cyanosis, or edema. Respiratory: Normal respiratory effort, no increased work of breathing. GI: Abdomen is soft, nontender, nondistended, no abdominal masses GU: Right CVA tenderness Skin: No rashes, bruises or suspicious lesions. Lymph: No cervical or inguinal adenopathy. Neurologic: Grossly intact, no focal deficits, moving all 4 extremities. Psychiatric: Normal mood and affect.  Laboratory Data: Lab Results  Component Value Date   WBC 12.2 (H) 02/03/2017   HGB 14.2 02/03/2017   HCT 40.6 02/03/2017   MCV 88.2 02/03/2017   PLT 264 02/03/2017    Lab Results  Component Value Date   CREATININE 1.15 02/03/2017   I have reviewed the labs  Pertinent Imaging: CLINICAL DATA:  Previous history of distal right ureteral stones, recurrent pain today  EXAM: ABDOMEN - 1 VIEW  COMPARISON:  02/03/2017  FINDINGS: Scattered large and small bowel gas is noted. Previously seen tiny renal calculus on the left is not well appreciated on the current exam. No definitive distal ureteral stones are noted. No bony abnormality is seen.  IMPRESSION: No definitive calculi identified.   Electronically Signed   By: Alcide Clever M.D.   On: 02/23/2017 16:33  I have independently reviewed the films.    Assessment & Plan:    1. Right ureteral stone  - no visible stones seen on KUB  - patient still will pain  - will obtain a RUS  - Advised to contact our office or seek  treatment in the ED if becomes febrile or pain/ vomiting are difficult control in order to arrange for emergent/urgent intervention   2. Right flank pain  - Oxycodone/acetaminophen 5/325, #10 prescribed - NCCSRS is reviewed - last script of #12 given on 02/03/2017 - advised of abuse potential and risk of overdose  3. Microscopic hematuria  - We will continue to monitor the patient's UA after the treatment/passage of the stone to ensure the hematuria has resolved.  If hematuria persists, we will pursue a hematuria workup with CT Urogram and cystoscopy if appropriate.  4. Right hydronephrosis  - obtain RUS  Return for RTC for RUS report.  Michiel Cowboy, PA-C  [facility] [address], [address] [place], [place] [address] [phone]

## 2017-02-23 ENCOUNTER — Ambulatory Visit (INDEPENDENT_AMBULATORY_CARE_PROVIDER_SITE_OTHER): Payer: Self-pay | Admitting: Urology

## 2017-02-23 ENCOUNTER — Encounter: Payer: Self-pay | Admitting: Urology

## 2017-02-23 ENCOUNTER — Ambulatory Visit
Admission: RE | Admit: 2017-02-23 | Discharge: 2017-02-23 | Disposition: A | Discharge status: Home / Self Care | Payer: Medicaid - Out of State | Source: Ambulatory Visit | Attending: Urology | Admitting: Urology

## 2017-02-23 VITALS — BP 133/84 | HR 88 | Ht 70.0 in | Wt 146.7 lb

## 2017-02-23 DIAGNOSIS — N201 Calculus of ureter: Secondary | ICD-10-CM

## 2017-02-23 DIAGNOSIS — R109 Unspecified abdominal pain: Secondary | ICD-10-CM

## 2017-02-23 DIAGNOSIS — R3129 Other microscopic hematuria: Secondary | ICD-10-CM

## 2017-02-23 DIAGNOSIS — N132 Hydronephrosis with renal and ureteral calculous obstruction: Secondary | ICD-10-CM

## 2017-02-23 MED ORDER — TAMSULOSIN HCL 0.4 MG PO CAPS: 0.4000 mg | ORAL_CAPSULE | Freq: Every day | ORAL | 0 refills | Status: AC

## 2017-02-23 MED ORDER — OXYCODONE-ACETAMINOPHEN 5-325 MG PO TABS: 1.0000 | ORAL_TABLET | ORAL | 0 refills | Status: AC | PRN

## 2017-02-28 ENCOUNTER — Ambulatory Visit: Admission: RE | Admit: 2017-02-28 | Payer: Medicaid - Out of State | Source: Ambulatory Visit

## 2017-03-02 ENCOUNTER — Ambulatory Visit: Payer: Self-pay | Admitting: Urology

## 2017-03-08 ENCOUNTER — Ambulatory Visit: Payer: Medicaid - Out of State

## 2017-03-08 NOTE — Progress Notes (Deleted)
03/09/2017 9:24 PM   Remi DeterJonathan Sliker 01/18/1986 [id number]  Referring provider: No referring provider defined for this encounter.  No chief complaint on file.   HPI: 31 yo WM who presents today for a follow up for a right ureteral stone.  Background history Consultation for right ureteral stone. Patient developed right flank pain 02/03/2017 and underwent a CT scan of the abdomen and pelvis. This revealed a 6 mm stone at the right ureterovesical junction working its way into the bladder. It may be visible on the scout. There were a couple of tiny 1 mm pieces of stone above it. No stones in the kidney. A 1 mm stone possibly in the left kidney. I reviewed all the images. At the time his UA showed 6-30 red blood cells, creatinine 1.15 and urine culture grew less than 10,000 colonies insignificant growth.  He's been taking cephalexin and tamsulosin. He has not seen a stone pass. He still gets crampy RLQ pain that seems to radiate to his back. He has no gross hematuria. No dysuria. No fever.  He has had one prior stone on the left. It passed a couple of years ago. He recently moved from [place].   Patient's UA reveals 0-5 white cells, 0-2 red cells, few bacteria.   Patient states he was having no symptoms of renal colic until last evening and this morning.  The pain is located in the right flank and radiating to his right side.  He is having some nausea and vomiting associated with the pain.  He states the pain is made worse with straightening of his back.  He is having associated frequency and urgency.   He is not having gross hematuria.  He has not passed a stone fragment.  KUB taken today did not demonstrate any calculi.     PMH: Past Medical History:  Diagnosis Date  . Anxiety   . Arthritis   . Kidney stone     Surgical History: No past surgical history on file.  Home Medications:  Allergies as of 03/09/2017   No Known Allergies     Medication List       Accurate as of 03/08/17  9:24  PM. Always use your most recent med list.          ibuprofen 200 MG tablet Commonly known as:  MOTRIN IB Take 3 tablets (600 mg total) by mouth every 6 (six) hours as needed.   ondansetron 4 MG tablet Commonly known as:  ZOFRAN Take 1 tablet (4 mg total) by mouth every 8 (eight) hours as needed for nausea or vomiting.   oxyCODONE-acetaminophen 5-325 MG tablet Commonly known as:  ROXICET Take 1 tablet by mouth every 4 (four) hours as needed for severe pain.   tamsulosin 0.4 MG Caps capsule Commonly known as:  FLOMAX Take 1 capsule (0.4 mg total) by mouth daily.       Allergies: No Known Allergies  Family History: Family History  Problem Relation Age of Onset  . Bladder Cancer Neg Hx   . Prostate cancer Neg Hx   . Kidney cancer Neg Hx     Social History:  reports that he has been smoking.  He has never used smokeless tobacco. He reports that he does not drink alcohol or use drugs.  ROS:  Physical Exam: There were no vitals taken for this visit.  Constitutional:  Alert and oriented, No acute distress. HEENT:  AT, moist mucus membranes.  Trachea midline, no masses. Cardiovascular: No clubbing, cyanosis, or edema. Respiratory: Normal respiratory effort, no increased work of breathing. GI: Abdomen is soft, nontender, nondistended, no abdominal masses GU: Right CVA tenderness Skin: No rashes, bruises or suspicious lesions. Lymph: No cervical or inguinal adenopathy. Neurologic: Grossly intact, no focal deficits, moving all 4 extremities. Psychiatric: Normal mood and affect.  Laboratory Data: Lab Results  Component Value Date   WBC 12.2 (H) 02/03/2017   HGB 14.2 02/03/2017   HCT 40.6 02/03/2017   MCV 88.2 02/03/2017   PLT 264 02/03/2017    Lab Results  Component Value Date   CREATININE 1.15 02/03/2017   I have reviewed the labs  Pertinent Imaging: CLINICAL DATA:  Previous history of distal  right ureteral stones, recurrent pain today  EXAM: ABDOMEN - 1 VIEW  COMPARISON:  02/03/2017  FINDINGS: Scattered large and small bowel gas is noted. Previously seen tiny renal calculus on the left is not well appreciated on the current exam. No definitive distal ureteral stones are noted. No bony abnormality is seen.  IMPRESSION: No definitive calculi identified.   Electronically Signed   By: Alcide CleverMark  Lukens M.D.   On: 02/23/2017 16:33  I have independently reviewed the films.    Assessment & Plan:    1. Right ureteral stone  - no visible stones seen on KUB  - patient still will pain  - will obtain a RUS  - Advised to contact our office or seek treatment in the ED if becomes febrile or pain/ vomiting are difficult control in order to arrange for emergent/urgent intervention   2. Right flank pain  - Oxycodone/acetaminophen 5/325, #10 prescribed - NCCSRS is reviewed - last script of #12 given on 02/03/2017 - advised of abuse potential and risk of overdose  3. Microscopic hematuria  - We will continue to monitor the patient's UA after the treatment/passage of the stone to ensure the hematuria has resolved.  If hematuria persists, we will pursue a hematuria workup with CT Urogram and cystoscopy if appropriate.  4. Right hydronephrosis  - obtain RUS  No Follow-up on file.  Michiel CowboySHANNON Mariluz Crespo, PA-C  [facility] [address], [address] [place], [place] [address] [phone]

## 2017-03-09 ENCOUNTER — Ambulatory Visit: Payer: Self-pay | Admitting: Urology

## 2017-03-09 ENCOUNTER — Ambulatory Visit: Payer: Medicaid - Out of State

## 2017-03-09 NOTE — Progress Notes (Deleted)
03/10/2017 9:56 PM   Barry DeterJonathan Walsh 1986/01/26 [id number]  Referring provider: No referring provider defined for this encounter.  No chief complaint on file.   HPI: 31 yo WM who presents today for a follow up for a right ureteral stone.  Background history Consultation for right ureteral stone. Patient developed right flank pain 02/03/2017 and underwent a CT scan of the abdomen and pelvis. This revealed a 6 mm stone at the right ureterovesical junction working its way into the bladder. It may be visible on the scout. There were a couple of tiny 1 mm pieces of stone above it. No stones in the kidney. A 1 mm stone possibly in the left kidney. I reviewed all the images. At the time his UA showed 6-30 red blood cells, creatinine 1.15 and urine culture grew less than 10,000 colonies insignificant growth.  He's been taking cephalexin and tamsulosin. He has not seen a stone pass. He still gets crampy RLQ pain that seems to radiate to his back. He has no gross hematuria. No dysuria. No fever.  He has had one prior stone on the left. It passed a couple of years ago. He recently moved from [place].   Patient's UA reveals 0-5 white cells, 0-2 red cells, few bacteria.   Patient states he was having no symptoms of renal colic until last evening and this morning.  The pain is located in the right flank and radiating to his right side.  He is having some nausea and vomiting associated with the pain.  He states the pain is made worse with straightening of his back.  He is having associated frequency and urgency.   He is not having gross hematuria.  He has not passed a stone fragment.  KUB taken today did not demonstrate any calculi.     PMH: Past Medical History:  Diagnosis Date  . Anxiety   . Arthritis   . Kidney stone     Surgical History: No past surgical history on file.  Home Medications:  Allergies as of 03/10/2017   No Known Allergies     Medication List       Accurate as of 03/09/17  9:56  PM. Always use your most recent med list.          ibuprofen 200 MG tablet Commonly known as:  MOTRIN IB Take 3 tablets (600 mg total) by mouth every 6 (six) hours as needed.   ondansetron 4 MG tablet Commonly known as:  ZOFRAN Take 1 tablet (4 mg total) by mouth every 8 (eight) hours as needed for nausea or vomiting.   oxyCODONE-acetaminophen 5-325 MG tablet Commonly known as:  ROXICET Take 1 tablet by mouth every 4 (four) hours as needed for severe pain.   tamsulosin 0.4 MG Caps capsule Commonly known as:  FLOMAX Take 1 capsule (0.4 mg total) by mouth daily.       Allergies: No Known Allergies  Family History: Family History  Problem Relation Age of Onset  . Bladder Cancer Neg Hx   . Prostate cancer Neg Hx   . Kidney cancer Neg Hx     Social History:  reports that he has been smoking.  He has never used smokeless tobacco. He reports that he does not drink alcohol or use drugs.  ROS:  Physical Exam: There were no vitals taken for this visit.  Constitutional:  Alert and oriented, No acute distress. HEENT:  AT, moist mucus membranes.  Trachea midline, no masses. Cardiovascular: No clubbing, cyanosis, or edema. Respiratory: Normal respiratory effort, no increased work of breathing. GI: Abdomen is soft, nontender, nondistended, no abdominal masses GU: Right CVA tenderness Skin: No rashes, bruises or suspicious lesions. Lymph: No cervical or inguinal adenopathy. Neurologic: Grossly intact, no focal deficits, moving all 4 extremities. Psychiatric: Normal mood and affect.  Laboratory Data: Lab Results  Component Value Date   WBC 12.2 (H) 02/03/2017   HGB 14.2 02/03/2017   HCT 40.6 02/03/2017   MCV 88.2 02/03/2017   PLT 264 02/03/2017    Lab Results  Component Value Date   CREATININE 1.15 02/03/2017   I have reviewed the labs  Pertinent Imaging: CLINICAL DATA:  Previous history of distal  right ureteral stones, recurrent pain today  EXAM: ABDOMEN - 1 VIEW  COMPARISON:  02/03/2017  FINDINGS: Scattered large and small bowel gas is noted. Previously seen tiny renal calculus on the left is not well appreciated on the current exam. No definitive distal ureteral stones are noted. No bony abnormality is seen.  IMPRESSION: No definitive calculi identified.   Electronically Signed   By: Alcide CleverMark  Lukens M.D.   On: 02/23/2017 16:33  I have independently reviewed the films.    Assessment & Plan:    1. Right ureteral stone  - no visible stones seen on KUB  - patient still will pain  - will obtain a RUS  - Advised to contact our office or seek treatment in the ED if becomes febrile or pain/ vomiting are difficult control in order to arrange for emergent/urgent intervention   2. Right flank pain  - Oxycodone/acetaminophen 5/325, #10 prescribed - NCCSRS is reviewed - last script of #12 given on 02/03/2017 - advised of abuse potential and risk of overdose  3. Microscopic hematuria  - We will continue to monitor the patient's UA after the treatment/passage of the stone to ensure the hematuria has resolved.  If hematuria persists, we will pursue a hematuria workup with CT Urogram and cystoscopy if appropriate.  4. Right hydronephrosis  - obtain RUS  No Follow-up on file.  Michiel CowboySHANNON Kitiara Hintze, PA-C  [facility] [address], [address] [place], [place] [address] [phone]

## 2017-03-10 ENCOUNTER — Encounter: Payer: Self-pay | Admitting: Urology

## 2017-03-10 ENCOUNTER — Ambulatory Visit: Payer: Self-pay | Admitting: Urology

## 2017-09-12 IMAGING — CT CT RENAL STONE PROTOCOL
3 of 4 series · 8 of 46 positions shown, 15 images · non-contrast
Comparison: None.

CLINICAL DATA: 30-year-old male with right flank and abdominal
pain.

EXAM:
CT ABDOMEN AND PELVIS WITHOUT CONTRAST
TECHNIQUE: Multidetector CT imaging of the abdomen and pelvis was performed
following the standard protocol without IV contrast.

[Series 4: lung bases · axial · 0.68mm/px · z∈[-141,-76]mm · 4 of 23 slices shown, 9 images]
[im 5/23  soft-tissue]
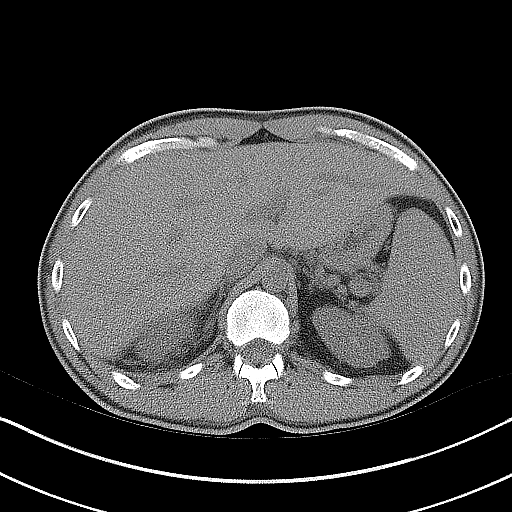
[im 5/23  lung]
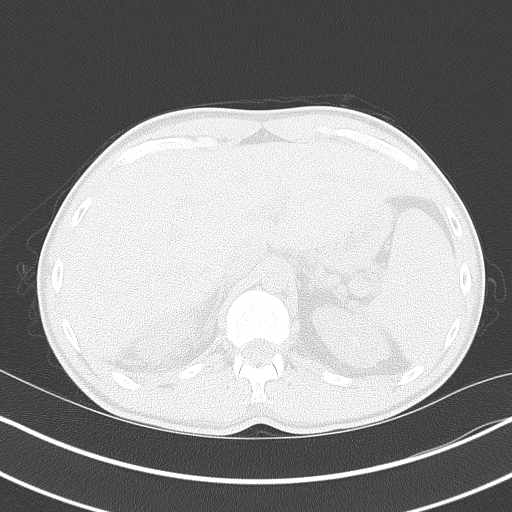
[im 5/23  bone]
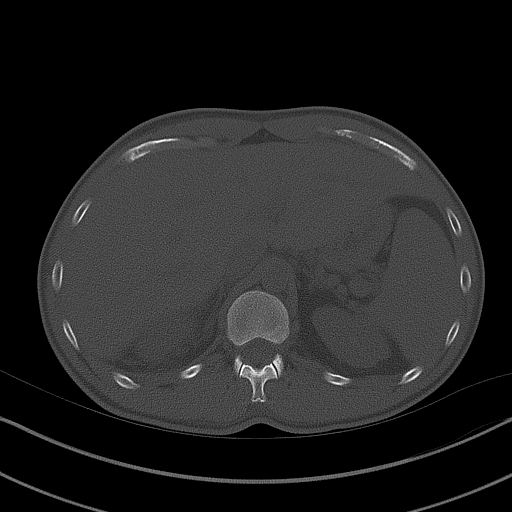
[im 9/23  soft-tissue]
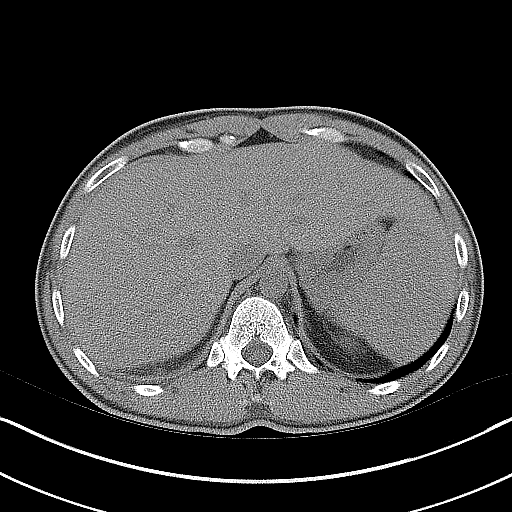
[im 9/23  lung]
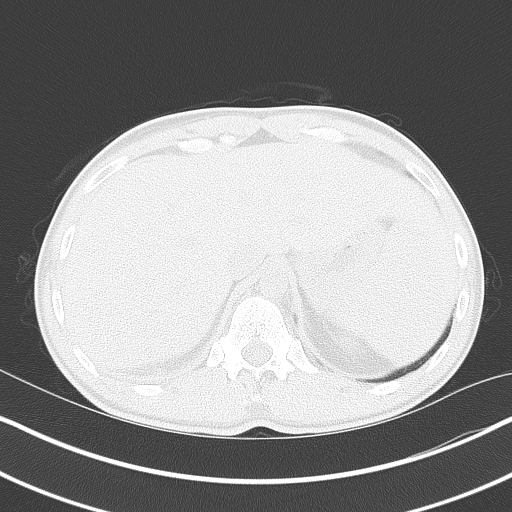
[im 14/23  soft-tissue]
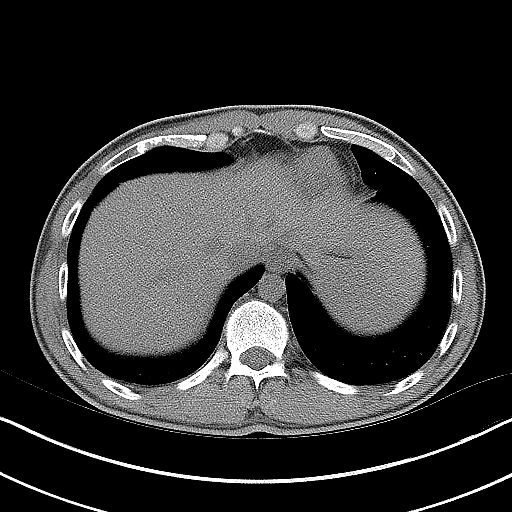
[im 14/23  lung]
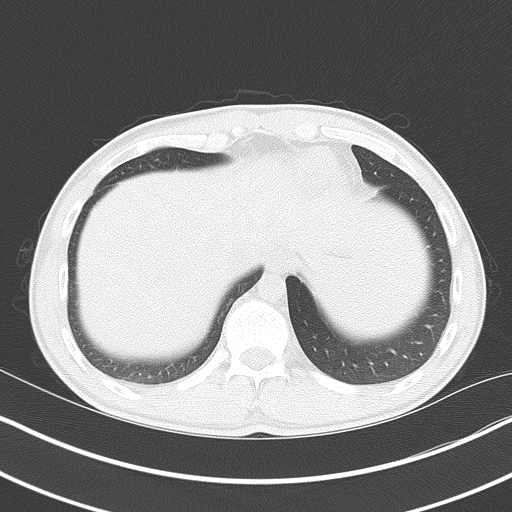
[im 18/23  soft-tissue]
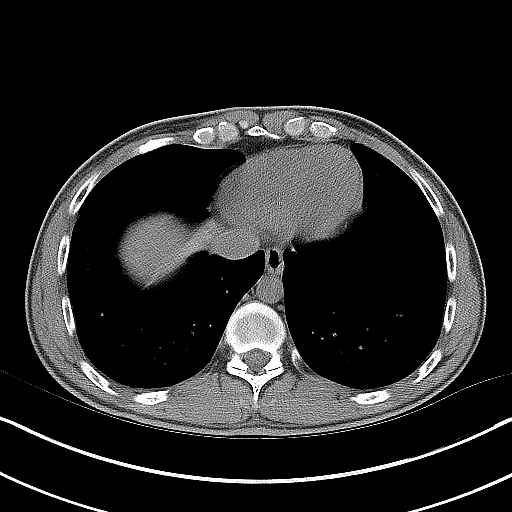
[im 18/23  lung]
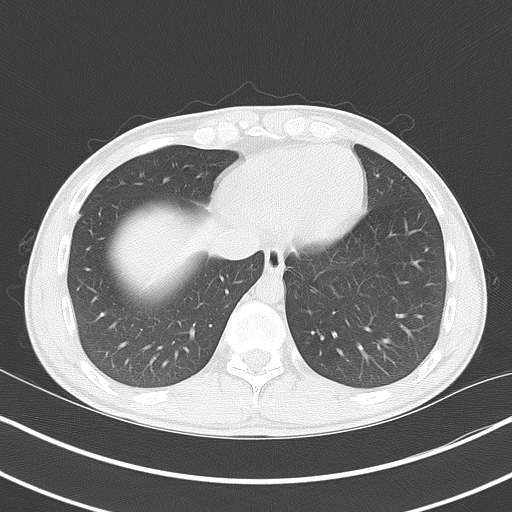

[Series 5: coronal · coronal · 0.75mm/px · 3 of 113 slices shown, 4 images]
[im 38/113  soft-tissue]
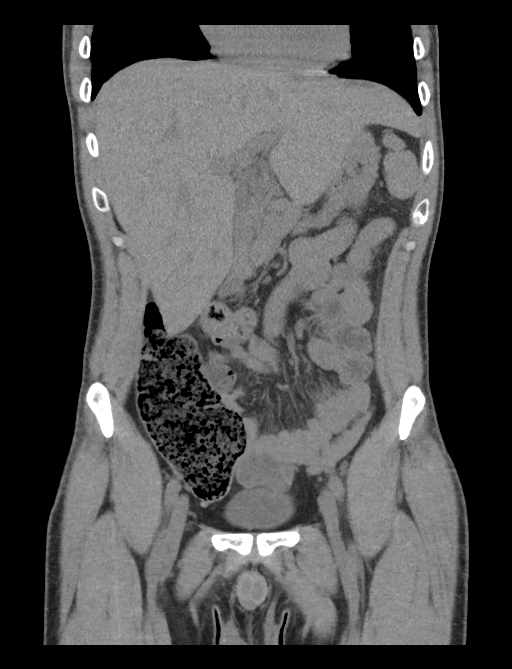
[im 50/113  soft-tissue]
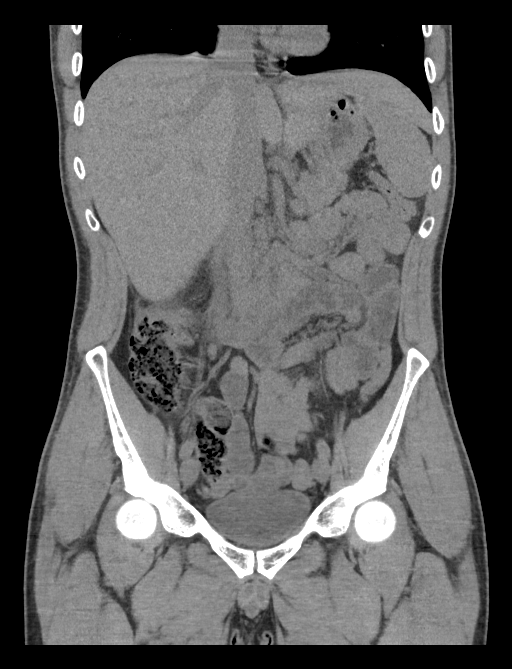
[im 50/113  bone]
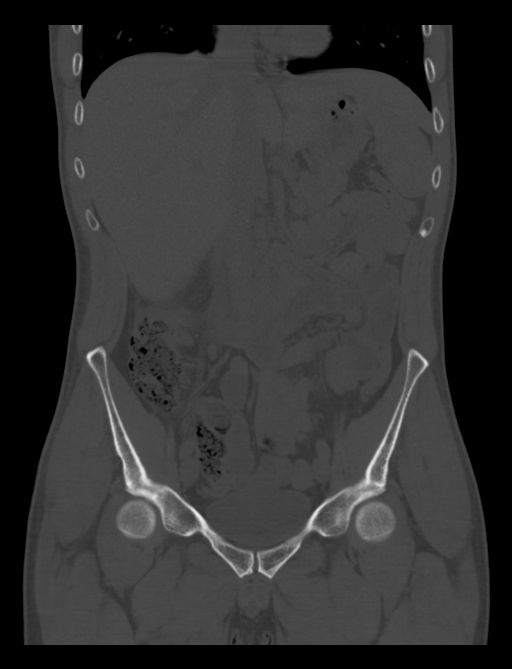
[im 63/113  soft-tissue]
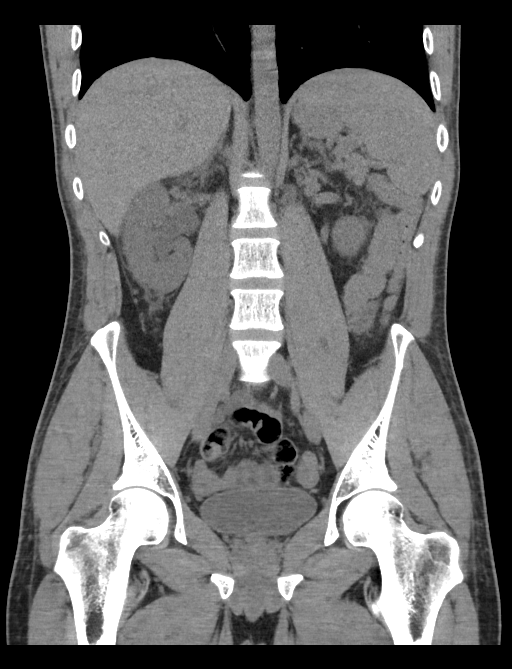

[Series 6: sagittal · sagittal · 0.55mm/px · 1 of 169 slices shown, 2 images]
[im 57/169  soft-tissue]
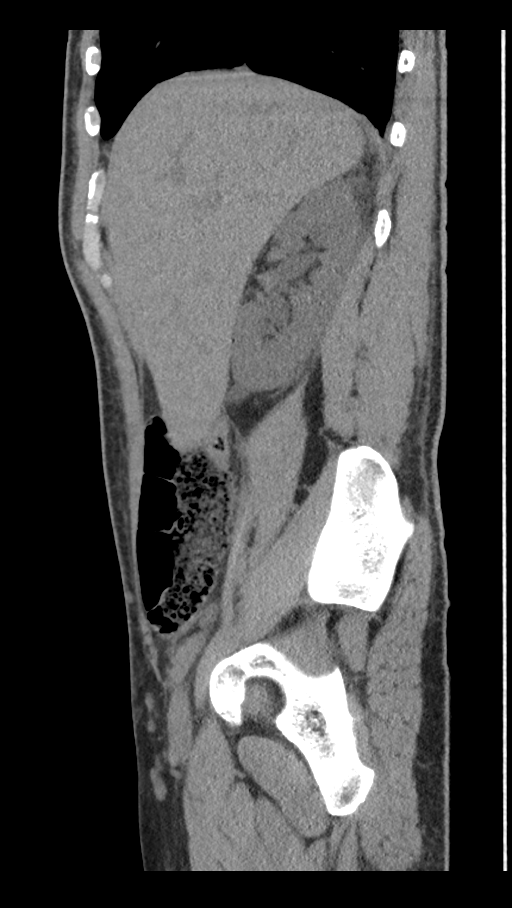
[im 57/169  bone]
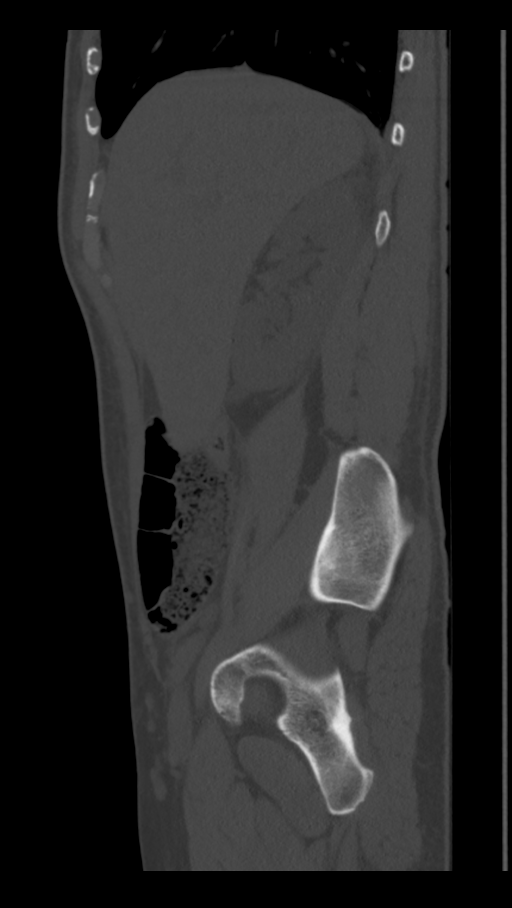

[8 of 46 positions shown; findings below may reference images not displayed]

FINDINGS: Please note that parenchymal abnormalities may be missed without
intravenous contrast.

Lower chest: No acute abnormality

Hepatobiliary: The liver and gallbladder are unremarkable.

Pancreas: Unremarkable

Spleen: Unremarkable

Adrenals/Urinary Tract: A 6 mm right UVJ calculus causes moderate
right hydroureteronephrosis. Two separate adjacent punctate calculi
within the distal right ureter just proximal to the UVJ calculus
noted.

A punctate nonobstructing mid left renal calculus is present. The
adrenal glands are unremarkable.

Stomach/Bowel: Stomach is within normal limits. Appendix appears
normal. No evidence of bowel wall thickening, distention, or
inflammatory changes.

Vascular/Lymphatic: No significant vascular findings are present. No
enlarged abdominal or pelvic lymph nodes.

Reproductive: Prostate is unremarkable.

Other: No ascites, focal collection or pneumoperitoneum.

Musculoskeletal: No acute or significant osseous findings.
IMPRESSION: 6 mm right UVJ calculus causing moderate right
hydroureteronephrosis. Two adjacent punctate calculi within the
distal right ureter, proximal to the UVJ calculus.

Punctate nonobstructing left renal calculus.

## 2017-10-02 IMAGING — CR DG ABDOMEN 1V
1 series · 2 of 2 positions shown · non-contrast
Comparison: 02/03/2017

CLINICAL DATA: Previous history of distal right ureteral stones,
recurrent pain today

EXAM:
ABDOMEN - 1 VIEW

[Series 1: dg abd 1 view · 0.14mm/px · 2 of 2 slices shown]
[im 1/2]
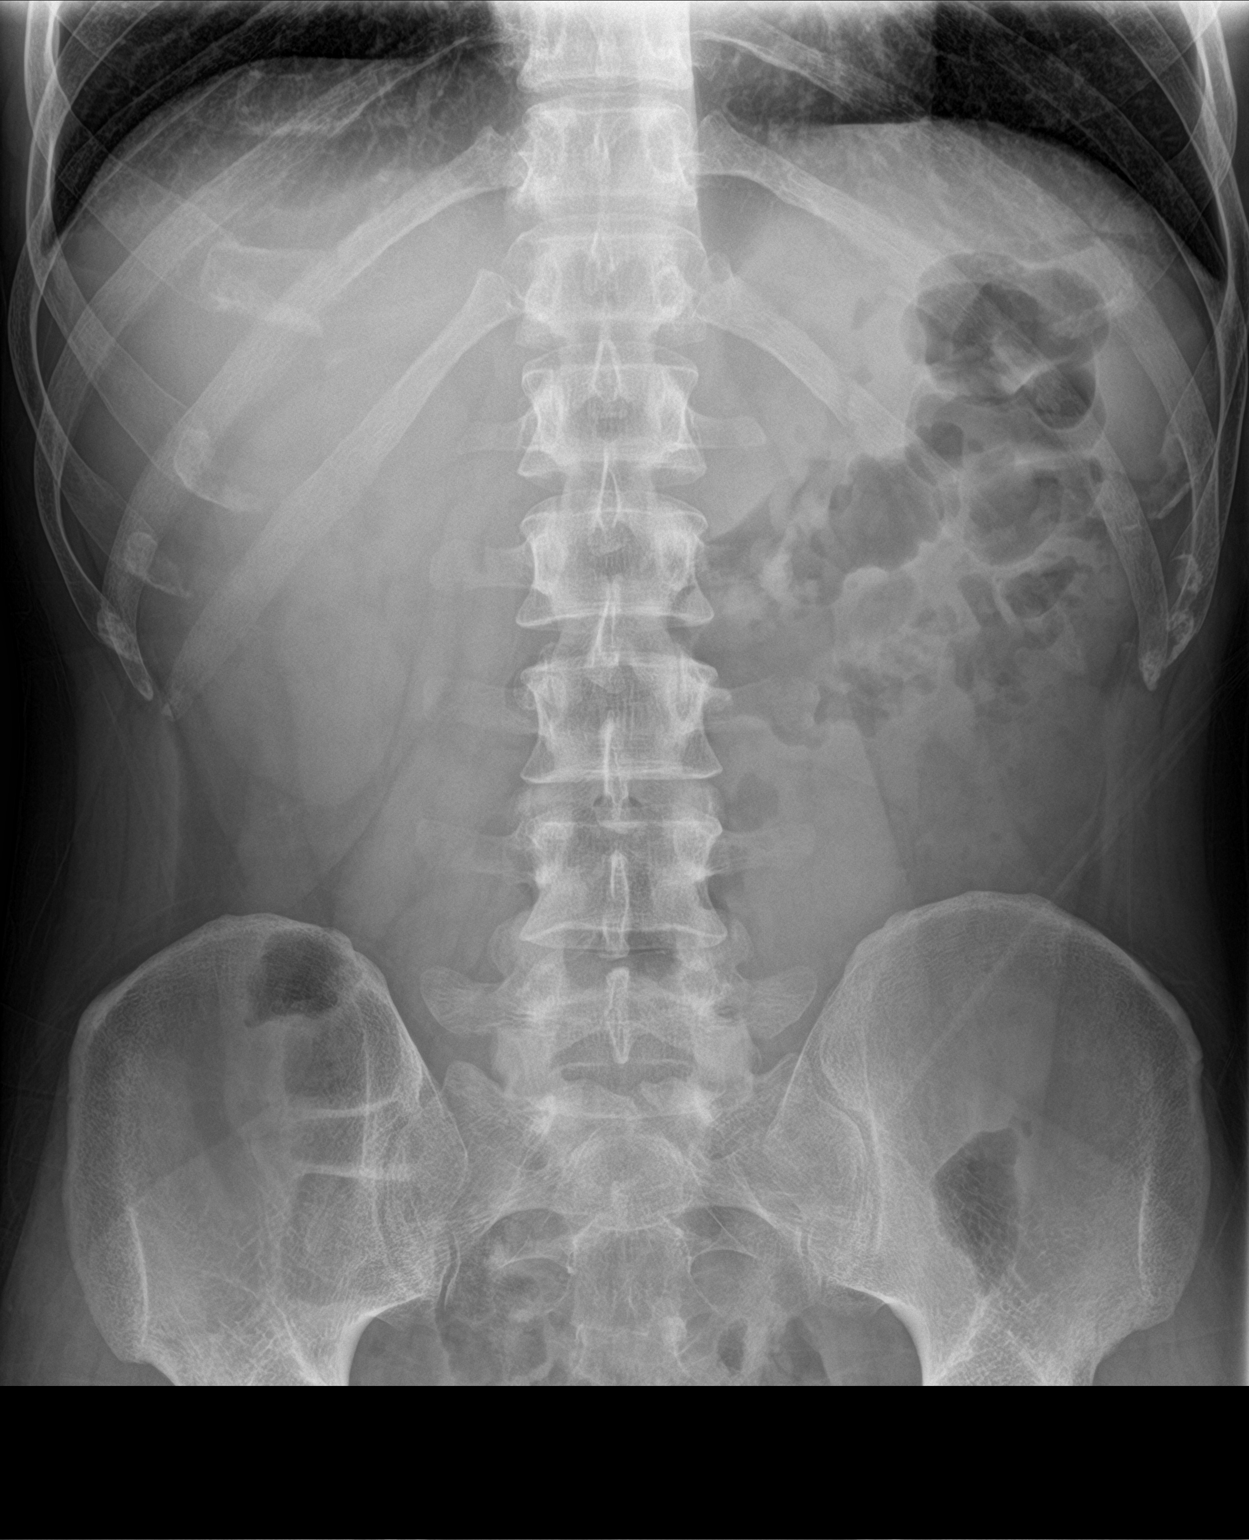
[im 2/2]
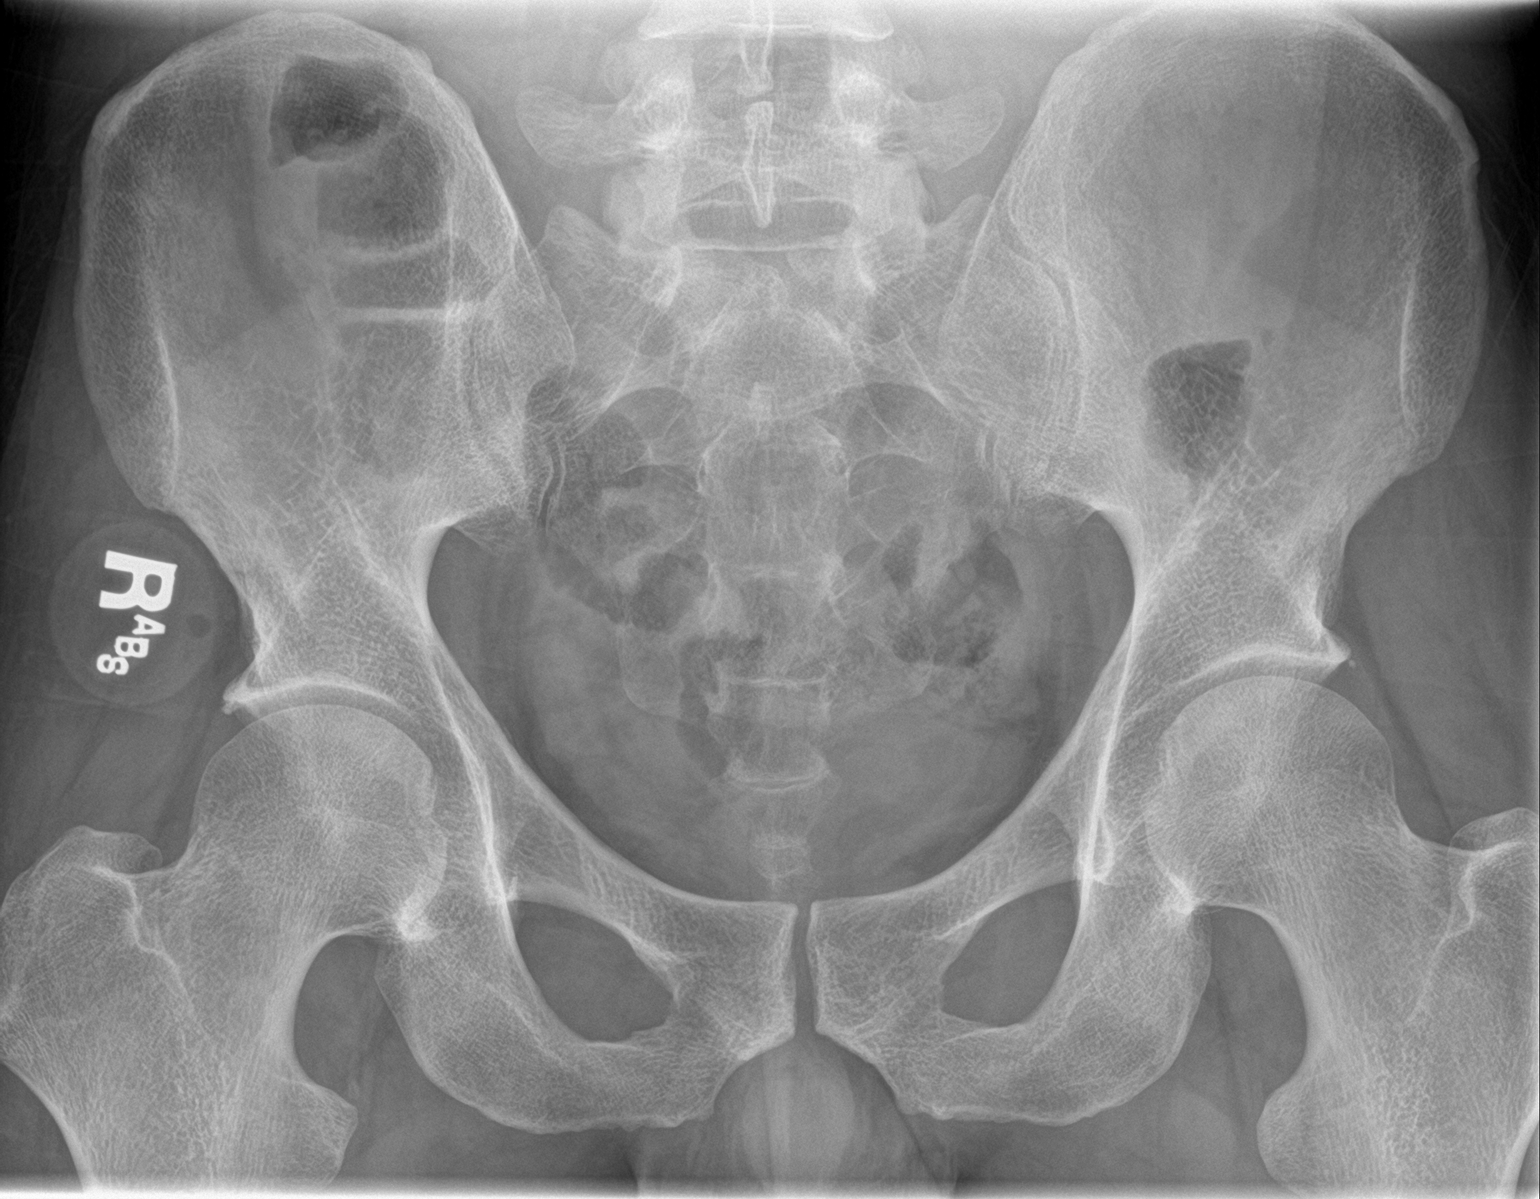

[2 of 2 positions shown; findings below may reference images not displayed]

FINDINGS: Scattered large and small bowel gas is noted. Previously seen tiny
renal calculus on the left is not well appreciated on the current
exam. No definitive distal ureteral stones are noted. No bony
abnormality is seen.
IMPRESSION: No definitive calculi identified.

## 2019-11-07 ENCOUNTER — Other Ambulatory Visit
Admission: RE | Admit: 2019-11-07 | Discharge: 2019-11-07 | Disposition: A | Discharge status: Home / Self Care | Payer: Medicaid Other | Source: Ambulatory Visit | Attending: Primary Care | Admitting: Primary Care

## 2019-11-08 LAB — COMPREHENSIVE METABOLIC PANEL
ALT: 28 U/L (ref 0–55)
AST (SGOT): 27 U/L (ref 10–42)
Albumin/Globulin Ratio: 1.63 Ratio (ref 0.80–2.00)
Albumin: 4.4 gm/dL (ref 3.5–5.0)
Alkaline Phosphatase: 68 U/L (ref 40–145)
Anion Gap: 19 mMol/L — ABNORMAL HIGH (ref 7.0–18.0)
BUN / Creatinine Ratio: 11.7 Ratio (ref 10.0–30.0)
BUN: 12 mg/dL (ref 7–22)
Bilirubin, Total: 1.2 mg/dL (ref 0.1–1.2)
CO2: 23 mMol/L (ref 20–30)
Calcium: 9.9 mg/dL (ref 8.5–10.5)
Chloride: 106 mMol/L (ref 98–110)
Creatinine: 1.03 mg/dL (ref 0.80–1.30)
EGFR: 94 mL/min/{1.73_m2} (ref 60–150)
Globulin: 2.7 gm/dL (ref 2.0–4.0)
Glucose: 108 mg/dL — ABNORMAL HIGH (ref 71–99)
Osmolality Calculated: 285 mOsm/kg (ref 275–300)
Potassium: 5 mMol/L (ref 3.5–5.3)
Protein, Total: 7.1 gm/dL (ref 6.0–8.3)
Sodium: 143 mMol/L (ref 136–147)

## 2019-11-08 LAB — CBC AND DIFFERENTIAL
Basophils %: 0.6 % (ref 0.0–3.0)
Basophils Absolute: 0 10*3/uL (ref 0.0–0.3)
Eosinophils %: 4.3 % (ref 0.0–7.0)
Eosinophils Absolute: 0.3 10*3/uL (ref 0.0–0.8)
Hematocrit: 46.4 % (ref 39.0–52.5)
Hemoglobin: 15.5 gm/dL (ref 13.0–17.5)
Lymphocytes Absolute: 1.7 10*3/uL (ref 0.6–5.1)
Lymphocytes: 22.3 % (ref 15.0–46.0)
MCH: 31 pg (ref 28–35)
MCHC: 33 gm/dL (ref 32–36)
MCV: 93 fL (ref 80–100)
MPV: 8.5 fL (ref 6.0–10.0)
Monocytes Absolute: 0.4 10*3/uL (ref 0.1–1.7)
Monocytes: 5.3 % (ref 3.0–15.0)
Neutrophils %: 67.5 % (ref 42.0–78.0)
Neutrophils Absolute: 5.1 10*3/uL (ref 1.7–8.6)
PLT CT: 276 10*3/uL (ref 130–440)
RBC: 5.02 10*6/uL (ref 4.00–5.70)
RDW: 11.9 % (ref 11.0–14.0)
WBC: 7.5 10*3/uL (ref 4.0–11.0)

## 2019-11-08 LAB — GGT: GGT: 21 U/L (ref 12–64)

## 2019-11-08 LAB — HEPATITIS B SURFACE ANTIGEN W/ REFLEX TO CONFIRMATION: Hepatitis B Surface Antigen: NONREACTIVE

## 2019-11-08 LAB — VH HEPATITIS B CORE ANTIBODY, TOTAL: Hepatitis B Core Total AB: NONREACTIVE

## 2019-11-08 LAB — HEPATITIS C ANTIBODY: Hepatitis C Antibody: NONREACTIVE

## 2019-11-08 LAB — HIV AG/AB 4TH GENERATION: HIV Ag/Ab, 4th Generation: NONREACTIVE

## 2019-11-09 LAB — VH HEPATITIS C RNA QUANTITATIVE PCR
HCV RNA PCR Quantitative: <1.18 log IU/mL
HCV RNA Quantitative Real Time PCR: <15 IU/mL

## 2019-11-12 LAB — HEPATITIS A ANTIBODY, TOTAL: Hepatitis A Total Antibody: NONREACTIVE

## 2019-12-03 ENCOUNTER — Emergency Department: Payer: 59

## 2019-12-03 ENCOUNTER — Emergency Department
Admission: EM | Admit: 2019-12-03 | Discharge: 2019-12-03 | Disposition: A | Discharge status: Home / Self Care | Payer: 59 | Attending: Family Medicine | Admitting: Family Medicine

## 2019-12-03 DIAGNOSIS — J340 Abscess, furuncle and carbuncle of nose: Secondary | ICD-10-CM | POA: Insufficient documentation

## 2019-12-03 DIAGNOSIS — Z20822 Contact with and (suspected) exposure to covid-19: Secondary | ICD-10-CM | POA: Insufficient documentation

## 2019-12-03 HISTORY — DX: Calculus of kidney: N20.0

## 2019-12-03 LAB — VH SOLANA (R) SARS-COV-2 MOLECULAR ASSAY
Does patient reside in a congregate care setting?: NEGATIVE
Is patient employed in a healthcare setting?: NEGATIVE
Is the patient pregnant?: NEGATIVE
Solana (R) SARS-Cov-2: NEGATIVE

## 2019-12-03 MED ORDER — MUPIROCIN 2 % EX OINT
TOPICAL_OINTMENT | Freq: Three times a day (TID) | CUTANEOUS | Status: DC
Start: 2019-12-03 — End: 2019-12-03

## 2019-12-03 MED ORDER — AMOXICILLIN-POT CLAVULANATE 875-125 MG PO TABS
ORAL_TABLET | ORAL | Status: AC
Start: 2019-12-03 — End: ?
  Filled 2019-12-03: qty 1

## 2019-12-03 MED ORDER — AMOXICILLIN-POT CLAVULANATE 875-125 MG PO TABS
875.00 mg | ORAL_TABLET | Freq: Once | ORAL | Status: AC
Start: 2019-12-03 — End: 2019-12-03
  Administered 2019-12-03: 17:00:00 1 via ORAL

## 2019-12-03 MED ORDER — AMOXICILLIN-POT CLAVULANATE 875-125 MG PO TABS
1.00 | ORAL_TABLET | Freq: Two times a day (BID) | ORAL | 0 refills | Status: AC
Start: 2019-12-03 — End: 2019-12-13

## 2019-12-03 MED ORDER — MUPIROCIN 2 % EX OINT
TOPICAL_OINTMENT | Freq: Once | CUTANEOUS | Status: AC
Start: 2019-12-03 — End: 2019-12-03

## 2019-12-03 MED ORDER — MUPIROCIN 2 % EX OINT
TOPICAL_OINTMENT | CUTANEOUS | Status: AC
Start: 2019-12-03 — End: ?
  Filled 2019-12-03: qty 22

## 2019-12-03 NOTE — ED Provider Notes (Addendum)
[facility]  Emergency Department       Patient Name: Kyle Hurley, Kyle Hurley Patient DOB:  [birth year]   Encounter Date:  12/03/2019 Age: 34 y.o. male   Attending ED Physician: Blima Dessert, MD MRN:  [id number]   Room:  4/ED4-A PCP: Pcp, None, MD      Diagnosis / Disposition:   Final Impression  1. Suspected COVID-19 virus infection    2. Cellulitis of nasal tip        Disposition              ED Disposition     ED Disposition Condition Date/Time Comment    Discharge  Mon Dec 03, 2019  4:52 PM Damarcus Reggio discharge to home/self care.    Condition at disposition: Stable          Follow up  [facility] Emergency Department  [address]  [place] [address]  [phone]    If symptoms worsen    Carlyle Lipa, MD  [address], [address]  [place] [address]  [phone]    Schedule an appointment as soon as possible for a visit        Prescriptions  New Prescriptions    AMOXICILLIN-CLAVULANATE (AUGMENTIN) 875-125 MG PER TABLET    Take 1 tablet by mouth 2 (two) times daily for 10 days         History of Presenting Illness:   Chief complaint: Cough, Headache, and Fever      XBM:WUXL is a 34 year old male patient known to have history of migraine  who is presenting here today for 3 days history of cough, fever, congestion, sinus pressure, sore throat and right nares pain.  Patient denies any abdominal pain, no chest pain, no flank pain, no urine symptoms, no other symptoms.  Patient is sitting comfortably without signs of acute distress.  To note, patient states "I have been using things that I am not supposed to in my right nose".   Patient is currently complaining of pain in his right nares.               No notes on file    In addition to the above history, please see nursing notes. Allergies, meds, past medical, family, social hx, and the results of the diagnostic studies performed have been reviewed by myself.      Review of Systems   Review of Systems    Constitutional: Positive for fever.   HENT: Positive for congestion, sinus pressure and sore throat. Negative for mouth sores, postnasal drip and sinus pain.         Right naris pain   Respiratory: Positive for cough.    Cardiovascular: Negative for chest pain.   All other systems reviewed and are negative.      All other systems reviewed and negative except as above, pertinent findings in HPI.        Allergies / Medications:   Pt has No Known Allergies.    Current/Home Medications    ACETAMINOPHEN (TYLENOL) 500 MG TABLET    Take 500 mg by mouth.    BUPRENORPHINE-NALOXONE (SUBOXONE) 2-0.5 MG SL TAB PER SL TABLET    Place under the tongue daily 8-2MG     CYCLOBENZAPRINE (FLEXERIL) 10 MG TABLET    Take 1 tablet (10 mg total) by mouth 3 (three) times daily as needed for Muscle spasms.    CYCLOBENZAPRINE (FLEXERIL) 10 MG TABLET  Take 1 tablet (10 mg total) by mouth 3 (three) times daily as needed for Muscle spasms.    IBUPROFEN (ADVIL,MOTRIN) 100 MG TABLET    Take 100 mg by mouth every 6 (six) hours as needed for Fever.    NAPROXEN (NAPROSYN) 250 MG TABLET    Take 1 tablet (250 mg total) by mouth 2 (two) times daily with meals.         Past History:   Medical: Pt has a past medical history of Calculus of kidney and Lumbar back pain.    Surgical: Pt  has no past surgical history on file.    Family: The family history is not on file.    Social: Pt reports that he has been smoking cigarettes. He has been smoking about 0.50 packs per day. His smokeless tobacco use includes chew. He reports current drug use. He reports that he does not drink alcohol.      Physical Exam:   Physical Exam  Vitals and nursing note reviewed.   Constitutional:       General: He is not in acute distress.     Appearance: Normal appearance. He is not ill-appearing, toxic-appearing or diaphoretic.   HENT:      Head: Normocephalic and atraumatic.      Right Ear: Tympanic membrane normal.      Left Ear: Tympanic membrane normal.      Nose: Nasal  tenderness and congestion present. No rhinorrhea.      Right Nostril: No epistaxis or septal hematoma.      Left Nostril: No epistaxis or septal hematoma.        Mouth/Throat:      Pharynx: Posterior oropharyngeal erythema present. No oropharyngeal exudate.   Eyes:      Extraocular Movements: Extraocular movements intact.   Cardiovascular:      Rate and Rhythm: Normal rate and regular rhythm.      Pulses: Normal pulses.      Heart sounds: Normal heart sounds.   Pulmonary:      Effort: Pulmonary effort is normal. No respiratory distress.      Breath sounds: Normal breath sounds. No stridor. No wheezing, rhonchi or rales.   Chest:      Chest wall: No tenderness.   Abdominal:      General: Abdomen is flat.      Tenderness: There is no abdominal tenderness. There is no right CVA tenderness or left CVA tenderness.   Musculoskeletal:         General: No swelling or tenderness. Normal range of motion.   Skin:     Capillary Refill: Capillary refill takes less than 2 seconds.   Neurological:      General: No focal deficit present.      Mental Status: He is alert and oriented to person, place, and time.      Sensory: No sensory deficit.      Motor: No weakness.   Psychiatric:         Mood and Affect: Mood normal.         Behavior: Behavior normal.             MDM:   MDM  Number of Diagnoses or Management Options  Cellulitis of nasal tip  Suspected COVID-19 virus infection  Diagnosis management comments: Patient presenting with COVID-19 viruslike infection symptoms.  Patient has no respiratory distress or hypoxia in the ER.  Chest x-ray no acute findings.  Patient has right nares tenderness, no cellulitis.  We will give him mupirocin and Augmentin.  Referral to see ENT the next 2 days.  Patient to come back in case of worsening symptoms.  Meanwhile patient can self quarantine for the next 10 days.  Tylenol as needed for pain or fever.  Patient to come back in case of worsening symptoms.  He agrees with plan.  Is comfortable  home.    Patient Progress  Patient progress: stable    4:55 PM      Course in ED:     4:55 PM      Diagnostic Results:   The results of the diagnostic studies have been reviewed by myself:    Radiologic Studies  XR Chest AP Portable    Result Date: 12/03/2019  No acute cardiopulmonary findings. ReadingStation:[email or web address]      Lab Studies        Procedure / Critical Care time/EKG:   Procedures    Total time providing critical care:     ATTESTATIONS   This chart was generated by an EMR and may contain errors or additions/omissions not intended by the user.         Blima Dessert, MD     Blima Dessert, MD  12/03/19 1655       Blima Dessert, MD  12/04/19 [phone]

## 2019-12-03 NOTE — ED Notes (Signed)
Chest XR in room

## 2019-12-05 ENCOUNTER — Emergency Department
Admission: EM | Admit: 2019-12-05 | Discharge: 2019-12-05 | Disposition: A | Discharge status: Home / Self Care | Payer: 59 | Attending: Emergency Medicine | Admitting: Emergency Medicine

## 2019-12-05 ENCOUNTER — Emergency Department: Payer: 59

## 2019-12-05 DIAGNOSIS — R002 Palpitations: Secondary | ICD-10-CM | POA: Insufficient documentation

## 2019-12-05 DIAGNOSIS — F419 Anxiety disorder, unspecified: Secondary | ICD-10-CM | POA: Insufficient documentation

## 2019-12-05 DIAGNOSIS — F1910 Other psychoactive substance abuse, uncomplicated: Secondary | ICD-10-CM | POA: Insufficient documentation

## 2019-12-05 DIAGNOSIS — R0602 Shortness of breath: Secondary | ICD-10-CM | POA: Insufficient documentation

## 2019-12-05 HISTORY — DX: Other specific personality disorders: F60.89

## 2019-12-05 LAB — CBC AND DIFFERENTIAL
Basophils %: 0.3 % (ref 0.0–3.0)
Basophils Absolute: 0 10*3/uL (ref 0.0–0.3)
Eosinophils %: 3.8 % (ref 0.0–7.0)
Eosinophils Absolute: 0.3 10*3/uL (ref 0.0–0.8)
Hematocrit: 44.1 % (ref 39.0–52.5)
Hemoglobin: 15.1 gm/dL (ref 13.0–17.5)
Lymphocytes Absolute: 2.2 10*3/uL (ref 0.6–5.1)
Lymphocytes: 30 % (ref 15.0–46.0)
MCH: 31 pg (ref 28–35)
MCHC: 34 gm/dL (ref 32–36)
MCV: 89 fL (ref 80–100)
MPV: 7.8 fL (ref 6.0–10.0)
Monocytes Absolute: 0.4 10*3/uL (ref 0.1–1.7)
Monocytes: 5.1 % (ref 3.0–15.0)
Neutrophils %: 60.9 % (ref 42.0–78.0)
Neutrophils Absolute: 4.4 10*3/uL (ref 1.7–8.6)
PLT CT: 301 10*3/uL (ref 130–440)
RBC: 4.94 10*6/uL (ref 4.00–5.70)
RDW: 11.4 % (ref 11.0–14.0)
WBC: 7.2 10*3/uL (ref 4.0–11.0)

## 2019-12-05 LAB — COMPREHENSIVE METABOLIC PANEL
ALT: 22 U/L (ref 0–55)
AST (SGOT): 23 U/L (ref 10–42)
Albumin/Globulin Ratio: 1.29 Ratio (ref 0.80–2.00)
Albumin: 4 gm/dL (ref 3.5–5.0)
Alkaline Phosphatase: 72 U/L (ref 40–145)
Anion Gap: 14.4 mMol/L (ref 7.0–18.0)
BUN / Creatinine Ratio: 8.7 Ratio — ABNORMAL LOW (ref 10.0–30.0)
BUN: 9 mg/dL (ref 7–22)
Bilirubin, Total: 0.9 mg/dL (ref 0.1–1.2)
CO2: 23 mMol/L (ref 20.0–30.0)
Calcium: 9.5 mg/dL (ref 8.5–10.5)
Chloride: 105 mMol/L (ref 98–110)
Creatinine: 1.03 mg/dL (ref 0.80–1.30)
EGFR: 94 mL/min/{1.73_m2} (ref 60–150)
Globulin: 3.1 gm/dL (ref 2.0–4.0)
Glucose: 133 mg/dL — ABNORMAL HIGH (ref 71–99)
Osmolality Calculated: 278 mOsm/kg (ref 275–300)
Potassium: 3.4 mMol/L — ABNORMAL LOW (ref 3.5–5.3)
Protein, Total: 7.1 gm/dL (ref 6.0–8.3)
Sodium: 139 mMol/L (ref 136–147)

## 2019-12-05 LAB — ETHANOL: Alcohol: <10 mg/dL (ref 0–9)

## 2019-12-05 LAB — ACETAMINOPHEN LEVEL: Acetaminophen Level: <0.6 ug/mL (ref 0.0–30.0)

## 2019-12-05 MED ORDER — SODIUM CHLORIDE 0.9 % IV BOLUS
1000.00 mL | Freq: Once | INTRAVENOUS | Status: AC
Start: 2019-12-05 — End: 2019-12-05
  Administered 2019-12-05: 1000 mL via INTRAVENOUS

## 2019-12-05 MED ORDER — NALOXONE HCL 4 MG/0.1ML NA LIQD
1.00 | Freq: Once | NASAL | 0 refills | Status: AC | PRN
Start: 2019-12-05 — End: ?

## 2019-12-05 MED ORDER — SODIUM CHLORIDE 0.9 % IV BOLUS
1000.00 mL | Freq: Once | INTRAVENOUS | Status: DC
Start: 2019-12-05 — End: 2019-12-05

## 2019-12-05 NOTE — ED Provider Notes (Signed)
History     Chief Complaint   Patient presents with    Anxiety    Medication Reaction     injected Meth     HPI 34 year old patient reports that he injected meth at 4 PM, and again at 10:30 PM, recreational use, he says he is not an addict and does not use regularly. He said after the second use he started to feel anxious, shortly before arrival he felt more anxious and was worried he might die, felt like things were closing in on him any felt some shortness of breath. He called EMS, now it is here he is more relaxed and feeling improved. He does have a history of anxiety. He never had chest pain, never had leg swelling, he did feel like his heart was racing. He takes Suboxone, prescribed by Dr. Joseph Art, he denies injecting this or any other substances. Denies alcohol use. Denies other drug or prescription or over-the-counter medication abuse. He does not have any chronic medical problems. He was just tested for Covid a few days ago at this ER and was negative.    Past Medical History:   Diagnosis Date    Calculus of kidney     Cluster B personality disorder     Lumbar back pain        History reviewed. No pertinent surgical history.    History reviewed. No pertinent family history.    Social  Social History     Tobacco Use    Smoking status: Current Every Day Smoker     Packs/day: 0.50     Types: Cigarettes    Smokeless tobacco: Current User     Types: Chew   Substance Use Topics    Alcohol use: No    Drug use: Yes     Comment: METH 3 DAYS       .     No Known Allergies    Home Medications     Med List Status: In Progress Set By: Glenford Bayley, RN at 12/05/2019  2:26 AM                acetaminophen (TYLENOL) 500 MG tablet     Take 500 mg by mouth.     amoxicillin-clavulanate (AUGMENTIN) 875-125 MG per tablet     Take 1 tablet by mouth 2 (two) times daily for 10 days     buprenorphine-naloxone 8-2 MG Film     DISSOLVE 1 FILM UNDER THE TONGUE DAILY FOR 7 DAYS     ondansetron (ZOFRAN) 4 MG  tablet     TAKE 2 TABLETS TWICE A DAY BY ORAL ROUTE AS NEEDED FOR 14 DAYS.                                                   Flagged for Removal             buprenorphine-naloxone (SUBOXONE) 2-0.5 MG SL Tab per SL tablet     Place under the tongue daily 8-2MG            Review of Systems   Constitutional: Negative.    HENT: Negative.    Eyes: Negative.    Respiratory: Positive for shortness of breath.         Now that he is here and relaxed he is no longer short of breath by  his report.   Cardiovascular: Positive for chest pain and palpitations. Negative for leg swelling.   Gastrointestinal: Negative.    Genitourinary: Negative.    Musculoskeletal: Negative.    Skin: Negative.    Neurological: Negative.    Hematological: Negative.    Psychiatric/Behavioral: Negative.    All other systems reviewed and are negative.      Physical Exam    BP: 135/80, Heart Rate: (!) 118, Temp: 98.4 F (36.9 C), Resp Rate: 20, SpO2: 99 %, Weight: 74.8 kg    Physical Exam  Vitals and nursing note reviewed.   Constitutional:       Appearance: Normal appearance.      Comments: Mildly anxious but rapidly improving. No unusual behavior. Cooperative.   HENT:      Head: Normocephalic.      Nose: Nose normal.      Mouth/Throat:      Mouth: Mucous membranes are moist.   Eyes:      Extraocular Movements: Extraocular movements intact.      Pupils: Pupils are equal, round, and reactive to light.      Comments: Pupils of normal size and reactivity.   Cardiovascular:      Rate and Rhythm: Regular rhythm. Tachycardia present.      Pulses: Normal pulses.      Heart sounds: No murmur. No friction rub. No gallop.       Comments: Heart rate 100, decreasing into the 80s while we are doing our interview.  Pulmonary:      Effort: Pulmonary effort is normal.      Breath sounds: Normal breath sounds.      Comments: Lungs clear to auscultation bilaterally without rales rhonchi or wheezes. No tachypnea or increased work of breathing.  Abdominal:      General:  Abdomen is flat. Bowel sounds are normal.      Palpations: Abdomen is soft.   Musculoskeletal:         General: Normal range of motion.      Cervical back: Normal range of motion and neck supple.   Skin:     Capillary Refill: Capillary refill takes less than 2 seconds.      Findings: No rash.      Comments: Track mark right antecubital space.   Neurological:      General: No focal deficit present.      Mental Status: He is alert and oriented to person, place, and time. Mental status is at baseline.   Psychiatric:         Mood and Affect: Mood normal.         Behavior: Behavior normal.      Comments: A bit anxious.     ekg - sinus tach, 102, no st elevation or depression, normal qtc    Results     Procedure Component Value Units Date/Time    Acetaminophen level [[id number]] Collected: 12/05/19 0229    Specimen: Plasma Updated: 12/05/19 0305     Acetaminophen Level <0.6 mcg/mL     Ethanol (Alcohol) Level [[id number]] Collected: 12/05/19 0229    Specimen: Plasma Updated: 12/05/19 0305     Alcohol <10 mg/dL     Comprehensive metabolic panel [[id number]]  (Abnormal) Collected: 12/05/19 0224    Specimen: Plasma Updated: 12/05/19 0302     Sodium 139 mMol/L      Potassium 3.4 mMol/L      Chloride 105 mMol/L      CO2 23.0 mMol/L  Calcium 9.5 mg/dL      Glucose 161 mg/dL      Creatinine 0.96 mg/dL      BUN 9 mg/dL      Protein, Total 7.1 gm/dL      Albumin 4.0 gm/dL      Alkaline Phosphatase 72 U/L      ALT 22 U/L      AST (SGOT) 23 U/L      Bilirubin, Total 0.9 mg/dL      Albumin/Globulin Ratio 1.29 Ratio      Anion Gap 14.4 mMol/L      BUN / Creatinine Ratio 8.7 Ratio      EGFR 94 mL/min/1.54m2      Osmolality Calculated 278 mOsm/kg      Globulin 3.1 gm/dL     CBC and differential [[id number]] Collected: 12/05/19 0224    Specimen: Blood Updated: 12/05/19 0253     WBC 7.2 K/cmm      RBC 4.94 M/cmm      Hemoglobin 15.1 gm/dL      Hematocrit 91.4 %      MCV 89 fL      MCH 31 pg      MCHC 34 gm/dL      RDW 78.2 %      PLT CT  301 K/cmm      MPV 7.8 fL      Neutrophils % 60.9 %      Lymphocytes 30.0 %      Monocytes 5.1 %      Eosinophils % 3.8 %      Basophils % 0.3 %      Neutrophils Absolute 4.4 K/cmm      Lymphocytes Absolute 2.2 K/cmm      Monocytes Absolute 0.4 K/cmm      Eosinophils Absolute 0.3 K/cmm      Basophils Absolute 0.0 K/cmm         XR Chest AP Portable    Result Date: 12/05/2019  No acute findings or substantial change. ReadingStation:[email or web address]        MDM and ED Course   240 -last use was 4 hours ago, I do not see signs of definite acute intoxication nor do I think he needs Ativan or benzos at this time, I do want to observe him here, and have ordered some lab tests and a chest x-ray and EKG. I believe this is more likely to be an anxiety attack than a cardiopulmonary problem given that now that he is here his symptoms seem to have resolved. He also has a history of anxiety which would make this more likely.    325 -feeling well, no complaints, heart rate 82, no sign of anxiety, no sign of being under the influence of any substances.  He has not given [place] a urine sample but I do not think I need 1 consists he has admitted to what he has used.  He would prefer to go now, he is taking a taxi home.  He certainly does appear well.  I have asked him to follow-up with his primary care doctor, which is Dr. Joseph Art.  He will return to the emergency room if any worsening.  I have strongly counseled him not to abuse drugs, and tried to get him to understand that just an occasional use is not a safe pattern.  I will send a prescription for Narcan to the pharmacy of his choice.  ED Medication Orders (From admission, onward)    Start Ordered  Status Ordering Provider    12/05/19 0241 12/05/19 0240  sodium chloride 0.9 % bolus 1,000 mL  Once in ED     Route: Intravenous  Ordered Dose: 1,000 mL     Acknowledged Nicolasa Ducking    12/05/19 0239 12/05/19 0238  sodium chloride 0.9 % bolus 1,000 mL  Once in ED     Route:  Intravenous  Ordered Dose: 1,000 mL     Last MAR action: New Bag Belvie Iribe, Al Corpus             MDM                 Procedures    Clinical Impression & Disposition     Clinical Impression  Final diagnoses:   Drug abuse   Anxiety        ED Disposition     ED Disposition Condition Date/Time Comment    Discharge  Wed Dec 05, 2019  3:29 AM Genelle Bal discharge to home/self care.    Condition at disposition: Stable           New Prescriptions    NALOXONE (NARCAN) 4 MG/0.1ML NASAL SPRAY    1 spray by Nasal route once as needed (opioid drug overdose) For signs of opioid overdose.                 Nicolasa Ducking, MD  12/05/19 [phone]

## 2019-12-05 NOTE — Discharge Instructions (Signed)
Drug Abuse  Use and abuse of drugs or medicines may lead to addiction or dependence. Illegal drugs include marijuana, amphetamines (speed, crank), cocaine, heroin, MDMA, ecstasy, bath salts, PCP, mescaline, and LSD. Medicines include prescription medicines, sedatives, and sleeping pills. Once addiction or dependence happens, you are at greater risk for any of the following.  Social and personal problems   Craving for the drug and not able to stop using even though you think you want to stop (psychological addiction)   Drug withdrawal symptoms if you stop taking the drug (physical dependence)   Loss of job or your family   Arrest, conviction, and jail sentence for possession of an illegal substance or for driving under the influence    Health problems   Strokes, heart attacks, and kidney failure   Accidental injuries to yourself or others while you are under the influence of the drug (in a car or at home)   HIV infection. This is a much greater risk if you use IV drugs.   Skin infections   Other sexually transmitted infections (STIs) such as herpes, chlamydia, and gonorrhea   Severe and fatal infection of the heart valves if you use IV drugs   Hepatitis B or C   Death from overdose    Home care  The following suggestions can help you care for yourself at home:   Admit you have a drug problem. Ask for help from your family and close friends.   Seek professional help. This could be one-on-one therapy or counseling. There are also outpatient, inpatient, and residential drug treatment programs.   Join a self-help group for drug abuse.   Stay away from friends who abuse drugs or tempt you to continue abusing drugs.   Eat a balanced diet and start a regular exercise program.  Follow-up care  Follow up with your healthcare provider, or as advised. Contact one of the resources below for help:   [employer] on Alcoholism and Drug Dependence, www.ncadd.[place] 161-096-0454   Narcotics Anonymous,  [employer]ch, ([phone]   National Alcohol and Substance Abuse Information Center, www.addictioncareoptions.com, 574-597-9119. This center can refer you to a treatment program.  Call 911  Call 911 if any of the following occur:   Seizure   Hard time breathing or slow, irregular breathing   Chest pain   Sudden weakness on one side of your body or sudden trouble speaking   Very drowsy or trouble awakening   Fainting or loss of consciousness   Rapid heart rate   Very slow heart rate  When to seek medical advice  Call your healthcare provider right away if any of these occur:   Agitation, anxiety, or unable to sleep   Unintended weight loss. This means more than 10 to 15 pounds over 3 months.   Fever of 100.72F (38C) or higher, or as directed by your healthcare provider   Shortness of breath   Cough with colored sputum   Redness, swelling, or tenderness at an injection site  StayWell last reviewed this educational content on 06/09/2018   2000-2020 [employer], [place]. [address] [place], [place] [address]. All rights reserved. This information is not intended as a substitute for professional medical care. Always follow your healthcare professional's instructions.      AnxietyReaction  Anxiety is the feeling we all get when we think something bad might happen. It is a normal response to stress and normally causes only a mild reaction. When anxiety becomes more severe, it caninterfere  with daily life. In some cases, you may not even be aware of what youre anxious about. There may also be a genetic link. Or it may be a learned behavior in the home.   Both psychological and physical triggers cause stress reaction. It's often a response to fear or emotional stress, real or imagined. This stress may come from home, family, work, or social relationships.   During an anxiety reaction, you may feel:   Helpless   Nervous   Depressed   Grouchy  Your body may show signs of anxiety in many ways. You  may experience:   Dry mouth   Shakiness   Dizziness   Weakness   Trouble breathing   Breathing fast (hyperventilating)   Chest pressure   Sweating   Headache   Nausea   Diarrhea   Tiredness   Inability to sleep   Sexual problems  Home care   Try to find the sources of stress in your life. They may not be obvious. These may include:  ? Daily hassles of life (such as traffic jams, missed appointments, or car troubles)  ? Major life changes, both good (new baby or job promotion) and bad (loss of job or loss of loved one)  ? Overload (feeling that you have too many responsibilities and can't take care of all of them at once)  ? Feeling helpless or feeling that your problems can't be solved   Notice how your body reacts to stress. Learn to listen to your body signals. This will help you take action before the stress becomes severe.   When you can, do something about the source of your stress. (Avoid hassles, limit the amount of change that happens in your life at one time, and take a break when you feel overloaded).   Unfortunately, many stressful situations can't be avoided. It is necessary to learn how to better manage stress. There are many proven methods that will reduce your anxiety. These include simple things such as exercise, good nutrition, and adequate rest. Also, there are certain techniques that are helpful:  ? Relaxation  ? Breathing exercises  ? Visualization  ? Biofeedback  ? Meditation  For more information about this, talk with your healthcare provider. Or check online or at your [employer] or bookstore. You'll find many books and audiobooks on this subject.   Follow-up care  If you feel your anxiety is not responding to self-help measures, call your healthcare provider or make an appointment with a counselor. You may need short-term psychological counseling or medicine to help you manage stress.   Call 911  Call 911 if any of these happen:    Trouble  breathing   Confusion   Drowsiness or trouble waking up   Fainting or loss of consciousness   Rapid heart rate   Seizure   New chest pain that becomes more severe, lasts longer, or spreads into your shoulder, arm, neck, jaw, or back  When to get medical advice  Call your healthcare provider right away if any of these happen:   Your symptoms get worse   Severe headache not eased by rest and mild pain reliever  StayWell last reviewed this educational content on 11/08/2018   2000-2020 [employer], [place]. [address], [place], [place] [address]. All rights reserved. This information is not intended as a substitute for professional medical care. Always follow your healthcare professional's instructions.      Thank you for choosing [facility] for  your emergency care needs. We strive to provide EXCELLENT care to you and your family.  YOUR ACCURATE CONTACT INFORMATION IS VERY IMPORTANT  Before leaving please check with registration to make sure we have an up-to-date contact number. A Toll-free post discharge Customer Service number is available to update your registration/insurance information as well as answer any billing questions or concerns. That number is [phone]   Discharge Message  YOU ARE THE MOST IMPORTANT FACTOR IN YOUR RECOVERY. Follow the above instructions carefully. Take your medicines as prescribed. Most important, see your  doctor in follow-up  as recommended by your ED physician    IF YOU DO NOT CONTINUE TO IMPROVE OR YOU HAVE ANY NEW, WORSENING O SEVERE SYMPTOMS, PLEASE CONTACT YOUR DOCTOR   IF YOUR REQUIRE IMMEDIATE ASSISTANCE, RETURN TO THE EMERGENCEY DEPARTMENT OR CALL 911.  MEDICAL RECORDS AND TESTS  Certain laboratory test results do not come back the same day, for example: urine cultures may take 3 days. We will attempt to contact you if other important findings are noted. Some lab testing may take 2-5 days. Radiology films are reviewed again to ensure accuracy.  If there is any discrepancy, we will notify you.     EXTRA AVAILABLE RESOURCES:  1. DOCTOR REFERRALS  a. Call  our Physician Referral Line at [phone]   b. [email or web address].  For physician referrals and other services that [facility] offers.    2. FREE HEALTH SERVICES  a. www.freemedicalsearch.org  b. http://www.[facility].org  May be utilized if you need help with health or social services, please call 2-1-1 for a free referral to resources in your area. 2-1-1 is a free service connecting people with information on health insurance, free clinics, pregnancy, mental health, dental care, food assistance, housing, and substance abuse counseling.  Pharmacy information  Prescriptions can be filled at the pharmacy of your choice.  The Emergency Department does not authorize prescription refills.  Please contact your primary care physician or clinic for this.    [facility] has been providing home care solutions for independent living since 1984. Serv[place] and [place]. [facility] is a full service home medical provider of home oxygen and respiratory care, medical equipment and supplies.  [phone]    Thanks Again, for allowing   [facility]   to serve you.  [phone]

## 2019-12-06 LAB — ECG 12-LEAD
P Wave Axis: 53 deg
P-R Interval: 114 ms
Patient Age: 33 years
Q-T Interval(Corrected): 437 ms
Q-T Interval: 335 ms
QRS Axis: 78 deg
QRS Duration: 92 ms
T Axis: 56 years
Ventricular Rate: 102 //min

## 2020-01-01 ENCOUNTER — Emergency Department: Payer: 59

## 2020-01-01 ENCOUNTER — Emergency Department
Admission: EM | Admit: 2020-01-01 | Discharge: 2020-01-01 | Disposition: A | Discharge status: Home / Self Care | Payer: 59 | Attending: Emergency Medicine | Admitting: Emergency Medicine

## 2020-01-01 DIAGNOSIS — Z20822 Contact with and (suspected) exposure to covid-19: Secondary | ICD-10-CM | POA: Insufficient documentation

## 2020-01-01 DIAGNOSIS — J069 Acute upper respiratory infection, unspecified: Secondary | ICD-10-CM | POA: Insufficient documentation

## 2020-01-01 DIAGNOSIS — J040 Acute laryngitis: Secondary | ICD-10-CM | POA: Insufficient documentation

## 2020-01-01 DIAGNOSIS — R05 Cough: Secondary | ICD-10-CM

## 2020-01-01 LAB — CBC AND DIFFERENTIAL
Basophils %: 0.8 % (ref 0.0–3.0)
Basophils Absolute: 0.1 10*3/uL (ref 0.0–0.3)
Eosinophils %: 4.6 % (ref 0.0–7.0)
Eosinophils Absolute: 0.4 10*3/uL (ref 0.0–0.8)
Hematocrit: 43 % (ref 39.0–52.5)
Hemoglobin: 15.1 gm/dL (ref 13.0–17.5)
Lymphocytes Absolute: 3.5 10*3/uL (ref 0.6–5.1)
Lymphocytes: 39.3 % (ref 15.0–46.0)
MCH: 31 pg (ref 28–35)
MCHC: 35 gm/dL (ref 32–36)
MCV: 89 fL (ref 80–100)
MPV: 7.8 fL (ref 6.0–10.0)
Monocytes Absolute: 0.6 10*3/uL (ref 0.1–1.7)
Monocytes: 7.3 % (ref 3.0–15.0)
Neutrophils %: 48 % (ref 42.0–78.0)
Neutrophils Absolute: 4.2 10*3/uL (ref 1.7–8.6)
PLT CT: 284 10*3/uL (ref 130–440)
RBC: 4.85 10*6/uL (ref 4.00–5.70)
RDW: 11.6 % (ref 11.0–14.0)
WBC: 8.8 10*3/uL (ref 4.0–11.0)

## 2020-01-01 LAB — COMPREHENSIVE METABOLIC PANEL
ALT: 15 U/L (ref 0–55)
AST (SGOT): 19 U/L (ref 10–42)
Albumin/Globulin Ratio: 1.52 Ratio (ref 0.80–2.00)
Albumin: 4.7 gm/dL (ref 3.5–5.0)
Alkaline Phosphatase: 67 U/L (ref 40–145)
Anion Gap: 15.6 mMol/L (ref 7.0–18.0)
BUN / Creatinine Ratio: 9.9 Ratio — ABNORMAL LOW (ref 10.0–30.0)
BUN: 11 mg/dL (ref 7–22)
Bilirubin, Total: 1.7 mg/dL — ABNORMAL HIGH (ref 0.1–1.2)
CO2: 26 mMol/L (ref 20.0–30.0)
Calcium: 10 mg/dL (ref 8.5–10.5)
Chloride: 106 mMol/L (ref 98–110)
Creatinine: 1.11 mg/dL (ref 0.80–1.30)
EGFR: 86 mL/min/{1.73_m2} (ref 60–150)
Globulin: 3.1 gm/dL (ref 2.0–4.0)
Glucose: 97 mg/dL (ref 71–99)
Osmolality Calculated: 286 mOsm/kg (ref 275–300)
Potassium: 3.6 mMol/L (ref 3.5–5.3)
Protein, Total: 7.8 gm/dL (ref 6.0–8.3)
Sodium: 144 mMol/L (ref 136–147)

## 2020-01-01 LAB — VH XPERT XPRESS(C) SARS-COV-2 QUALITATIVE PCR
Date of Onset: 20210521
Does patient reside in a congregate care setting?: NEGATIVE
Is patient admitted to the intensive care unit?: NEGATIVE
Is patient employed in a healthcare setting?: NEGATIVE
Is the patient hospitalized because of this condition?: NEGATIVE
Is the patient pregnant?: NEGATIVE
SARS CoV-2 by PCR: NEGATIVE

## 2020-01-01 MED ORDER — ACETAMINOPHEN 10 MG/ML IV SOLN
INTRAVENOUS | Status: AC
Start: 2020-01-01 — End: ?
  Filled 2020-01-01: qty 100

## 2020-01-01 MED ORDER — ACETAMINOPHEN 10 MG/ML IV SOLN
1000.00 mg | Freq: Once | INTRAVENOUS | Status: AC
Start: 2020-01-01 — End: 2020-01-01
  Administered 2020-01-01: 11:00:00 1000 mg via INTRAVENOUS

## 2020-01-01 MED ORDER — IOHEXOL 350 MG/ML IV SOLN
100.00 mL | Freq: Once | INTRAVENOUS | Status: AC | PRN
Start: 2020-01-01 — End: 2020-01-01
  Administered 2020-01-01: 12:00:00 100 mL via INTRAVENOUS

## 2020-01-01 MED ORDER — SODIUM CHLORIDE 0.9 % IV BOLUS
500.00 mL | Freq: Once | INTRAVENOUS | Status: AC
Start: 2020-01-01 — End: 2020-01-01
  Administered 2020-01-01: 12:00:00 500 mL via INTRAVENOUS

## 2020-01-01 NOTE — Discharge Instructions (Signed)
Viral Upper Respiratory Illness (Adult)    You have a viral upper respiratory illness (URI), which is another term for the common cold. This illness is contagious during the first few days. It is spread through the air by coughing and sneezing. It may also be spread by direct contact (touching the sick person and then touching your own eyes, nose, or mouth). Frequent handwashing will decrease risk of spread. Most viral illnesses go away within 7 to 10 days with rest and simple home remedies. Sometimes the illness may last for several weeks. Antibiotics will not kill a virus, and they are generally not prescribed for this condition.  Home care  · If symptoms are severe, rest at home for the first 2 to 3 days. When you resume activity, don't let yourself get too tired.  · Don't smoke. If you need help stopping, talk with your healthcare provider.  · Avoid being exposed to cigarette smoke (yours or others’).  · You may use acetaminophen or ibuprofen to control pain and fever, unless another medicine was prescribed. If you have chronic liver or kidney disease, have ever had a stomach ulcer or gastrointestinal bleeding, or are taking blood-thinning medicines, talk with your healthcare provider before using these medicines. Aspirin should never be given to anyone under 18 years of age who is ill with a viral infection or fever. It may cause severe liver or brain damage.  · Your appetite may be poor, so a light diet is fine. Stay well hydrated by drinking 6 to 8 glasses of fluids per day (water, soft drinks, juices, tea, or soup). Extra fluids will help loosen secretions in the nose and lungs.  · Over-the-counter cold medicines will not shorten the length of time you’re sick, but they may be helpful for the following symptoms: cough, sore throat, and nasal and sinus congestion. If you take prescription medicines, ask your healthcare provider or pharmacist which over-the-counter medicines are safe to use. (Note: Don't use  decongestants if you have high blood pressure.)  Follow-up care  Follow up with your healthcare provider, or as advised.  When to seek medical advice  Call your healthcare provider right away if any of these occur:  · Cough with lots of colored sputum (mucus)  · Severe headache; face, neck, or ear pain  · Difficulty swallowing due to throat pain  · Fever of 100.4°F (38°C) or higher, or as directed by your healthcare provider  Call 911  Call 911 if any of these occur:  · Chest pain, shortness of breath, wheezing, or difficulty breathing  · Coughing up blood  · Very severe pain with swallowing, especially if it goes along with a muffled voice   StayWell last reviewed this educational content on 01/07/2017  © 2000-2020 [employer]. [address], [place] [address]. All rights reserved. This information is not intended as a substitute for professional medical care. Always follow your healthcare professional's instructions.

## 2020-01-01 NOTE — ED Provider Notes (Signed)
[facility] EMERGENCY DEPARTMENT HISTORY AND PHYSICAL EXAM      Patient Name: Kyle Hurley, Kyle Hurley  Encounter Date:  01/01/2020  ED Provider: Gladis Riffle, M.D.  PCP: Oneita Hurt, None, MD  Patient DOB:  1986/03/13  MRN:  [id number]  Room:  4/ED4-A      History of Presenting Illness:   Chief complaint: Cough    HPI/ROS is limited by: none  HPI/ROS given by: patient    Location: home  Duration: 4 days  Severity: moderate    Kyle Hurley is a 34 y.o. male who presents with cough shortness of breath.  Patient is a 34 year old smoker who states that he has been sick for 4 days with a nonproductive cough and laryngitis he also has pleuritic chest pain with the cough or deep breathing.  He feels short of breath when he takes in a breath.  He admits that he has anxiety and his girlfriend/significant other states that he says he short of breath but she does not notice it when he is at home.    Past Medical History:     Past Medical History:   Diagnosis Date    Calculus of kidney     Cluster B personality disorder     Lumbar back pain        Past Surgical History:   History reviewed. No pertinent surgical history.    Family History:   History reviewed. No pertinent family history.    Social History:     Social History     Socioeconomic History    Marital status: Single     Spouse name: Not on file    Number of children: Not on file    Years of education: Not on file    Highest education level: Not on file   Occupational History    Not on file   Tobacco Use    Smoking status: Current Every Day Smoker     Packs/day: 0.50     Types: Cigarettes    Smokeless tobacco: Current User     Types: Chew   Substance and Sexual Activity    Alcohol use: No    Drug use: Not Currently     Comment: METH 3 DAYS    Sexual activity: Not on file   Other Topics Concern    Not on file   Social History Narrative    Not on file     Social Determinants of Health     Financial Resource Strain:     Difficulty of Paying Living Expenses:    Food  Insecurity:     Worried About Programme researcher, broadcasting/film/video in the Last Year:     Barista in the Last Year:    Transportation Needs:     Freight forwarder (Medical):     Lack of Transportation (Non-Medical):    Physical Activity:     Days of Exercise per Week:     Minutes of Exercise per Session:    Stress:     Feeling of Stress :    Social Connections:     Frequency of Communication with Friends and Family:     Frequency of Social Gatherings with Friends and Family:     Attends Religious Services:     Active Member of Clubs or Organizations:     Attends Banker Meetings:     Marital Status:    Intimate Partner Violence:     Fear of Current or Ex-Partner:  Emotionally Abused:     Physically Abused:     Sexually Abused:        Allergies:   No Known Allergies    Medications:     Discharge Medication List as of 01/01/2020 12:40 PM      CONTINUE these medications which have NOT CHANGED    Details   buprenorphine-naloxone 8-2 MG Film DISSOLVE 1 FILM UNDER THE TONGUE DAILY FOR 7 DAYS, Historical Med      ondansetron (ZOFRAN) 4 MG tablet TAKE 2 TABLETS TWICE A DAY BY ORAL ROUTE AS NEEDED FOR 14 DAYS., Historical Med      acetaminophen (TYLENOL) 500 MG tablet Take 500 mg by mouth., Until Discontinued, Historical Med      buprenorphine-naloxone (SUBOXONE) 2-0.5 MG SL Tab per SL tablet Place under the tongue daily 8-2MG , Historical Med      naloxone (NARCAN) 4 MG/0.1ML nasal spray 1 spray by Nasal route once as needed (opioid drug overdose) For signs of opioid overdose., Starting Wed 12/05/2019, [facility]              Review of Systems:   Ears:  No ear pain.    Nose:  No congestion.  No discharge    Throat:  No sore throat.  No difficulty swallowing.  Laryngitis worse in the morning    Cardiovascular: Pleuritic chest pain.  No palpitations.    Respiratory: Nonproductive cough.  ? shortness of breath.    GU:  No dysuria.    Neurological:  No headache.  No weakness.    Musculoskeletal:   Pleuritic  chest pain    Skin:  No rash.  No skin lesions.    Endocrine:  No weight change    Psychiatric:  No depression.  No anxiety.      All other systems reviewed and negative except as above, pertinent findings in HPI.      Physical Exam:   Triage vitals  ED Triage Vitals [01/01/20 0935]   Enc Vitals Group      BP       Heart Rate 90      Resp Rate 18      Temp 98.6 F (37 C)      Temp Source Oral      SpO2 97 %      Weight 72.6 kg      Height 1.778 m      Head Circumference       Peak Flow       Pain Score 7      Pain Loc       Pain Edu?       Excl. in GC?       most recent vitalsBP 139/87    Pulse 90    Temp 98.6 F (37 C) (Oral)    Resp 18    Ht 1.778 m    Wt 72.6 kg    SpO2 99%    BMI 22.96 kg/m     Constitutional:  Vitals signs reviewed. Well-appearing.  Well-nourished.  Dry hacking cough.  Mild acute distress.    Head:  Atraumatic, normocephalic    Eyes:  Pupils equal, round, and reactive to light .  Conjunctiva clear. No injection.    Ears: TMs normal.  Ear canals patent.      Nose:  Mucous membranes moist.  No discharge.     Neck:  Supple, non tender.  No cervical lymphadenopathy.  Negative stridor    Respiratory:  Breath sounds normal.  No distress.  No wheezing rales or rhonchi.  Good air movement throughout    Chest:  Non tender    Cardiovascular:  Heart regular rate and rhythm.  No murmurs/gallops/rubs.  Pulses +2 bilaterally    Abdomen:  Soft. Non-tender.     Back:  No CVA tenderness bilaterally    Extremities:  Full range of motion.  No edema.  No cyanosis.  No deformity.  No calf tenderness    Skin:  Warm.  Dry.  No pallor.  No rashes.  No lesions.  No bruises    Neurological:  Alert. Oriented to person,place, time.  GCS 15.      Psychiatric:  Normal affect.  No anxiety.  No depression.  No agitation.        Orders Placed:     Orders Placed This Encounter   Procedures    Respiratory Specimen Xpert Xpress(R) SARS-CoV-2 Qualitative PCR    XR Chest AP Portable    CT Soft Tissue Neck W Contrast    CBC  and differential    Comprehensive metabolic panel    Saline lock IV       Diagnostic Results:     The results of the diagnostic studies below have been reviewed by myself:    Labs  Results     Procedure Component Value Units Date/Time    Respiratory Specimen Xpert Xpress(R) SARS-CoV-2 Qualitative PCR [[id number]] Collected: 01/01/20 6578    Specimen: Nasopharyngeal Swab Updated: 01/01/20 1128     SARS CoV-2 by PCR Negative     Does patient have symptoms related to condition of interest? Y     Date of Onset [id number]     Is the patient hospitalized because of this condition? N     Is patient admitted to the intensive care unit? N     Is patient employed in a healthcare setting? N     Does patient reside in a congregate care setting? N     Is the patient pregnant? N    Narrative:      Specimen source - Nasopharyngeal Swab       Comprehensive metabolic panel [[id number]]  (Abnormal) Collected: 01/01/20 0951    Specimen: Plasma Updated: 01/01/20 1030     Sodium 144 mMol/L      Potassium 3.6 mMol/L      Chloride 106 mMol/L      CO2 26.0 mMol/L      Calcium 10.0 mg/dL      Glucose 97 mg/dL      Creatinine 0.27 mg/dL      BUN 11 mg/dL      Protein, Total 7.8 gm/dL      Albumin 4.7 gm/dL      Alkaline Phosphatase 67 U/L      ALT 15 U/L      AST (SGOT) 19 U/L      Bilirubin, Total 1.7 mg/dL      Albumin/Globulin Ratio 1.52 Ratio      Anion Gap 15.6 mMol/L      BUN / Creatinine Ratio 9.9 Ratio      EGFR 86 mL/min/1.31m2      Osmolality Calculated 286 mOsm/kg      Globulin 3.1 gm/dL     CBC and differential [[id number]] Collected: 01/01/20 0951    Specimen: Blood Updated: 01/01/20 1008     WBC 8.8 K/cmm      RBC 4.85 M/cmm      Hemoglobin 15.1 gm/dL      Hematocrit 47.4 %  MCV 89 fL      MCH 31 pg      MCHC 35 gm/dL      RDW 29.5 %      PLT CT 284 K/cmm      MPV 7.8 fL      Neutrophils % 48.0 %      Lymphocytes 39.3 %      Monocytes 7.3 %      Eosinophils % 4.6 %      Basophils % 0.8 %      Neutrophils Absolute 4.2 K/cmm       Lymphocytes Absolute 3.5 K/cmm      Monocytes Absolute 0.6 K/cmm      Eosinophils Absolute 0.4 K/cmm      Basophils Absolute 0.1 K/cmm           Radiologic Studies  Radiology Results (24 Hour)     Procedure Component Value Units Date/Time    CT Soft Tissue Neck W Contrast [[id number]] Collected: 01/01/20 1214    Order Status: Completed Updated: 01/01/20 1220    Narrative:      Clinical History:  Rule out retropharyngeal abnormality    Ordering Comments:   None.      Study Notes:   Cough, congestion, sore throat     Examination:  CT soft tissue neck with intravenous contrast. Multiplanar reconstructions obtained.    CT images were acquired utilizing Automated Exposure Control for dose reduction.     Contrast:  IOHEXOL 350 MG/ML IV SOLN/100 mL    Comparison:  CT examination of the cervical spine performed on July 12, 2016.    Findings:  CT examination the soft tissues was performed in the  axial plane after infusion of nonionic intravenous contrast. The examination began above the level of the skull base and extended to the level of the aortic arch. Sagittal and coronal reconstruction   was examination does demonstrate a 11 mm mucosal retention cyst within the right maxillary sinus. The remaining paranasal sinuses, mastoid air cells, and middle ears are clear. Normal appearance of the nasopharynx, oropharynx, and hypopharynx. No neck   mass or pathological cervical lymphadenopathy is identified. No inflammatory change is seen. No retropharyngeal effusion is identified. No evidence of tonsillar abscess is been seen. No airway compromise is identified. Normal appearance the epiglottis,   aryepiglottic folds, true and false cords are seen. There is straightening of the normal cervical lordosis. There is preservation vertebral body height and disc space height at all levels. No supraclavicular or superior mediastinal adenopathy is   identified. The upper lungs are clear.      Impression:      Normal soft tissue neck  CT. No neck mass or pathological cervical lymphadenopathy is identified. No inflammatory change is seen. No airway compromise is identified. There is an 11 mm retention cyst within the right maxillary sinus.    ReadingStation:[email or web address]    XR Chest AP Portable [[id number]] Collected: 01/01/20 1015    Order Status: Completed Updated: 01/01/20 1016    Narrative:      Clinical History:  Patient experiencing shortness of breath and cough x 4 days.      Examination:  Frontal view of the chest.    Comparison:  December 05, 2019    Findings:  The heart is normal size and configuration.   Pulmonary vascularity normal.   Lungs clear and normally aerated.   No pleural effusions or pneumothorax.   Bones normal.   Soft-tissues unremarkable.  Impression:      Normal Chest.    ReadingStation:[email or web address]          EKG: none    Procedure:   0946 patient states that he is short of breath but his oxygen saturation is anywhere from 98 to 100% on room air.  1044 CBC and CMP unremarkable.  Chest x-ray shows no acute abnormality as per the radiologist.  1131 Covid test is negative.  1237 CT of the neck shows no evidence of epiglottitis or any other retropharyngeal abnormality.  Assessment/Plan:   Patient was not tachycardic or tachypneic.  No hypoxia no leg symptoms do not think this is a PE I think this is all a viral URI with laryngitis.  Patient was told what to watch for and the need to follow-up sooner.  Because of his concern of his laryngitis we will have him follow-up with ENT they determined that they need to scope him  The patient's presentation is suggestive of a viral syndrome. The patient is clinically well appearing. Bacterial or other serious etiologies like strep throat, meningitis, lyme disease, pneumonia were considered but are felt unlikely based upon the history and exam. There is no history of immune compromise. There are no meningeal or sepsis indicators. The patient appears stable for discharge and fever precautions  were given. The patient was encouraged to follow-up with their primary care physician as needed and for review of pending labs if instructed to do so.  Diagnostic impression and plan were discussed with the patient and/or family.  Results of lab/radiology tests were reviewed and discussed with the patient and/or family. All questions were answered and concerns addressed. The patient appears appropriate for outpatient management with symptomatic treatment and primary care physician follow-up.  Warned to return immediately for worsening symptoms or any concerns.                  Diagnosis / Disposition     Clinical Impression  1. Viral URI with cough    2. Laryngitis        Disposition  ED Disposition     ED Disposition Condition Date/Time Comment    Discharge  Tue Jan 01, 2020 12:40 PM Kyle Hurley discharge to home/self care.    Condition at disposition: Stable          Prescriptions  Discharge Medication List as of 01/01/2020 12:40 PM                   Suella Broad, MD  01/01/20 [phone]

## 2020-01-01 NOTE — ED Notes (Signed)
Pt in CT.

## 2020-01-01 NOTE — ED Notes (Signed)
MD at bedside. 

## 2020-01-01 NOTE — ED Triage Notes (Signed)
Pt reports non-productive cough x4 days. Pt reports worsening at night. Increased cough x2 days and reports SOB. SpO2 100% on RA.

## 2020-03-21 ENCOUNTER — Ambulatory Visit
Admission: RE | Admit: 2020-03-21 | Discharge: 2020-03-21 | Disposition: A | Discharge status: Home / Self Care | Source: Ambulatory Visit | Attending: Medical | Admitting: Medical

## 2020-03-21 ENCOUNTER — Other Ambulatory Visit: Payer: Self-pay | Admitting: Medical

## 2020-03-21 DIAGNOSIS — A159 Respiratory tuberculosis unspecified: Secondary | ICD-10-CM

## 2020-03-21 DIAGNOSIS — Z111 Encounter for screening for respiratory tuberculosis: Secondary | ICD-10-CM | POA: Insufficient documentation

## 2020-04-01 ENCOUNTER — Other Ambulatory Visit
Admission: RE | Admit: 2020-04-01 | Discharge: 2020-04-01 | Disposition: A | Discharge status: Home / Self Care | Source: Ambulatory Visit | Attending: Medical | Admitting: Medical

## 2020-04-01 ENCOUNTER — Ambulatory Visit

## 2020-04-01 DIAGNOSIS — Z1152 Encounter for screening for COVID-19: Secondary | ICD-10-CM

## 2020-04-01 LAB — VH APTIMA SARS-COV-2 ASSAY (PANTHER SYSTEM)(TM)
Aptima SARS-CoV-2: NEGATIVE
Does patient have symptoms related to condition of interest?: NEGATIVE
Does patient reside in a congregate care setting?: NEGATIVE
Is patient admitted to the intensive care unit?: NEGATIVE
Is patient employed in a healthcare setting?: NEGATIVE
Is the patient hospitalized because of this condition?: NEGATIVE
Is the patient pregnant?: NEGATIVE
